# Patient Record
Sex: Male | Born: 1995 | Race: White | Hispanic: No | Marital: Single | Smoking: Never smoker
Health system: Southern US, Community
[De-identification: ages and names within clinical notes are randomized; demographics above are authoritative.]

## PROBLEM LIST (undated history)

## (undated) DIAGNOSIS — R310 Gross hematuria: Secondary | ICD-10-CM

## (undated) DIAGNOSIS — G809 Cerebral palsy, unspecified: Secondary | ICD-10-CM

## (undated) DIAGNOSIS — R569 Unspecified convulsions: Secondary | ICD-10-CM

## (undated) DIAGNOSIS — Z931 Gastrostomy status: Secondary | ICD-10-CM

## (undated) HISTORY — DX: Gross hematuria: R31.0

## (undated) HISTORY — DX: Gastrostomy status: Z93.1

## (undated) HISTORY — PX: CIRCUMCISION: SUR203

## (undated) HISTORY — DX: Cerebral palsy, unspecified: G80.9

## (undated) HISTORY — DX: Unspecified convulsions: R56.9

---
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
## 2017-01-17 ENCOUNTER — Encounter: Payer: Self-pay | Admitting: Pediatrics

## 2017-01-31 ENCOUNTER — Encounter: Payer: Self-pay | Admitting: Pediatrics

## 2017-02-23 ENCOUNTER — Other Ambulatory Visit (INDEPENDENT_AMBULATORY_CARE_PROVIDER_SITE_OTHER): Payer: Self-pay | Admitting: Neurology

## 2017-02-23 DIAGNOSIS — G40309 Generalized idiopathic epilepsy and epileptic syndromes, not intractable, without status epilepticus: Secondary | ICD-10-CM

## 2017-02-27 ENCOUNTER — Ambulatory Visit (INDEPENDENT_AMBULATORY_CARE_PROVIDER_SITE_OTHER): Payer: BLUE CROSS/BLUE SHIELD | Admitting: Pediatrics

## 2017-02-27 VITALS — Wt 97.0 lb

## 2017-02-27 DIAGNOSIS — L249 Irritant contact dermatitis, unspecified cause: Secondary | ICD-10-CM

## 2017-02-27 MED ORDER — LORATADINE 5 MG/5ML PO SYRP: 10.0000 mg | ORAL_SOLUTION | Freq: Every day | ORAL | 12 refills | Status: DC

## 2017-02-27 MED ORDER — SILVER SULFADIAZINE 1 % EX CREA: 1.0000 "application " | TOPICAL_CREAM | Freq: Two times a day (BID) | CUTANEOUS | 2 refills | Status: AC

## 2017-02-27 NOTE — Patient Instructions (Signed)

## 2017-02-27 NOTE — Progress Notes (Signed)
   22 year old with developmental delay and wheelchair bound now presents with raised red itchy rash to neck for the past three days. Mom says that he has been drooling a lot and the saliva is pooling on the right of his neck causing redness and peeling to the area.No fever, no discharge, no swelling of the area.   Review of Systems  Constitutional: Negative.  Negative for fever, activity change and appetite change.  HENT: Negative.  Negative for congestion and rhinorrhea.   Eyes: Negative.   Respiratory: Negative.  Negative for cough and wheezing.   Cardiovascular: Negative.   Gastrointestinal: Negative.   Musculoskeletal: wheelchair bound Neurological: developmental delay.  Hematological: Negative for adenopathy.         Objective:    Physical Exam  Constitutional: Appears well-developed and well-nourished.  HENT:  Right Ear: Tympanic membrane normal.  Left Ear: Tympanic membrane normal.  Nose: No nasal discharge.  Mouth/Throat: Mucous membranes are moist. No tonsillar exudate. Persistent drooling with neck flexed in position due to wheelchair back not able to recline.  Neck: Normal range of motion. No adenopathy. erythematous peeling rash to right side of neck extending to chest. Cardiovascular: Regular rhythm.  No murmur heard. Pulmonary/Chest: Effort normal. No respiratory distress. No retractions.  Abdominal: Soft. Bowel sounds are normal. No distension.  Musculoskeletal: No edema and no deformity.  Neurological: Alert and actve.  Skin: Skin is warm. Rash to neck as above.      Assessment:     Irritant contact dermatitis    Plan:   To prevent the drooling onto his neck he would need a New wheelchair required--custom back Silvadene cream BID X 1 week Follow if not improving

## 2017-02-28 ENCOUNTER — Encounter: Payer: Self-pay | Admitting: Pediatrics

## 2017-02-28 DIAGNOSIS — L249 Irritant contact dermatitis, unspecified cause: Secondary | ICD-10-CM | POA: Insufficient documentation

## 2017-04-07 ENCOUNTER — Telehealth: Payer: Self-pay | Admitting: Pediatrics

## 2017-04-07 NOTE — Telephone Encounter (Signed)
Form for  Medical necessity.

## 2017-05-07 ENCOUNTER — Telehealth: Payer: Self-pay | Admitting: Pediatrics

## 2017-05-07 NOTE — Telephone Encounter (Signed)
Letter for wheelchair written

## 2017-05-27 ENCOUNTER — Encounter: Payer: Self-pay | Admitting: Pediatrics

## 2017-06-27 ENCOUNTER — Other Ambulatory Visit (INDEPENDENT_AMBULATORY_CARE_PROVIDER_SITE_OTHER): Payer: Self-pay | Admitting: Neurology

## 2017-06-27 DIAGNOSIS — G40309 Generalized idiopathic epilepsy and epileptic syndromes, not intractable, without status epilepticus: Secondary | ICD-10-CM

## 2017-07-07 ENCOUNTER — Encounter: Payer: Self-pay | Admitting: Pediatrics

## 2017-07-07 ENCOUNTER — Ambulatory Visit (INDEPENDENT_AMBULATORY_CARE_PROVIDER_SITE_OTHER): Payer: BLUE CROSS/BLUE SHIELD | Admitting: Pediatrics

## 2017-07-07 VITALS — Temp 98.8°F | Wt 100.0 lb

## 2017-07-07 DIAGNOSIS — J309 Allergic rhinitis, unspecified: Secondary | ICD-10-CM | POA: Diagnosis not present

## 2017-07-07 MED ORDER — KETOCONAZOLE 2 % EX CREA: 1.0000 "application " | TOPICAL_CREAM | Freq: Every day | CUTANEOUS | 2 refills | Status: AC

## 2017-07-07 MED ORDER — MONTELUKAST SODIUM 10 MG PO TABS: 10.0000 mg | ORAL_TABLET | Freq: Every day | ORAL | 6 refills | Status: DC

## 2017-07-07 MED ORDER — BUDESONIDE 0.5 MG/2ML IN SUSP: 0.5000 mg | Freq: Every day | RESPIRATORY_TRACT | 12 refills | Status: DC

## 2017-07-07 MED ORDER — ALBUTEROL SULFATE (2.5 MG/3ML) 0.083% IN NEBU: 2.5000 mg | INHALATION_SOLUTION | Freq: Four times a day (QID) | RESPIRATORY_TRACT | 6 refills | Status: DC | PRN

## 2017-07-07 NOTE — Patient Instructions (Signed)
How to Use a Nebulizer, Adult A nebulizer is a device that turns liquid medicine into a mist (vapor) that you can breathe in (inhale). You may need to use a nebulizer if you have a breathing illness, such as asthma or pneumonia. There are different kinds of nebulizers. With some, you breathe in through a mouthpiece. With others, a mask fits over your nose and mouth. Risks and complications Using a nebulizer that does not fit right or is not cleaned right can lead to the following complications:  Infection.  Eye irritation.  Delivery of too much medicine or not enough medicine.  Mouth irritation.  How to prepare before using a nebulizer Take these steps before using your nebulizer: 1. Check your medicine. Make sure it has not expired and is not damaged in any way. 2. Wash your hands with soap and water. 3. Put all of the parts of your nebulizer on a sturdy, flat surface. Make sure all of the tubing is connected. 4. Measure the liquid medicine according to instructions from your health care provider. Pour the liquid into the part of the nebulizer that holds the medicine (reservoir). 5. Attach the mouthpiece or mask. 6. Test the nebulizer by turning it on to make sure that a spray comes out. Then, turn it off.  How to use a nebulizer 1. Sit down and relax. 2. If your nebulizer has a mask, put it over your nose and mouth. It should fit somewhat snugly, with no gaps around the nose or cheeks where medicine could escape. If you use a mouthpiece, put it in your mouth. Press your lips firmly around the mouthpiece. 3. Turn on the nebulizer. 4. Breathe out (exhale). 5. Some nebulizers have a finger valve. If yours does, cover up the air hole so the air gets to the nebulizer. 6. Once the medicine begins to mist out, take slow, deep breaths. If there is a finger valve, release it at the end of your breath. 7. Continue taking slow, deep breaths until the medicine in the nebulizer is gone and no  vapor appears. Be sure to stop the machine at any time if you start coughing or if the medicine foams or bubbles. How to clean a nebulizer The nebulizer and all of its parts must be kept very clean. If the nebulizer and its parts are not cleaned properly, bacteria can grow inside of them. If you inhale the bacteria, you can get sick. Follow the manufacturer's instructions for cleaning your nebulizer. For most nebulizers, you should follow these guidelines:  Wash the nebulizer after each use. Use warm water and soap. Make sure to wash the mouthpiece or mask and the medicine area, but do not wash the tubing or mouthpiece.  After you wash the nebulizer, place its parts on a clean towel and let them dry completely. After they dry, reconnect the pieces and turn the nebulizer on without any medicine in it. Doing this will blow air through the equipment to help dry it out.  Store the nebulizer in a clean and dust-free place.  Check the filter at least one time every week. Replace it if it looks dirty.  Contact a health care provider if:  You continue to have trouble breathing.  You have trouble using the nebulizer.  Your breathing gets worse during a nebulizer treatment.  Your nebulizer stops working, foams, or does not create a mist after you add medicine and turn it on. This information is not intended to replace advice given to   you by your health care provider. Make sure you discuss any questions you have with your health care provider. Document Released: 12/19/2008 Document Revised: 08/31/2015 Document Reviewed: 07/08/2015 Elsevier Interactive Patient Education  2018 Elsevier Inc.  

## 2017-07-08 ENCOUNTER — Encounter: Payer: Self-pay | Admitting: Pediatrics

## 2017-07-08 NOTE — Progress Notes (Signed)
Subjective:     [name] is a 22 y.o. male [place] who presents for evaluation and treatment of allergic symptoms. Symptoms include: clear rhinorrhea, cough, nasal congestion and postnasal drip and are present in a seasonal pattern. Precipitants include: ?Marland Kitchen Treatment currently includes nasal saline, oral antihistamines: zyrtec/claritin and is not effective. The following portions of the patient's history were reviewed and updated as appropriate: allergies, current medications, past family history, past medical history, past social history, past surgical history and problem list.  Review of Systems Pertinent items are noted in HPI.    Objective:    Temp 98.8 F (37.1 C) (Temporal)   Wt 100 lb (45.4 kg) Comment: reported  BMI 17.16 kg/m  General appearance: uncooperative and developmental delay Ears: normal TM's and external ear canals both ears Nose: clear discharge, mild congestion, turbinates swollen Lungs: clear to auscultation bilaterally Heart: regular rate and rhythm, S1, S2 normal, no murmur, click, rub or gallop Abdomen: g tube in situ Skin: scaly rash to right elbow Neurologic: global developmental delay   Assessment:    Allergic rhinitis.    Tine corporis   Plan:    Medications: nasal saline, intranasal steroids: flonase, oral decongestants: zyrtec, inhaled steroids: pulmicort, leukotrienes inhibitors:  singulair. Allergen avoidance discussed. Follow-up in a few days.

## 2017-09-04 ENCOUNTER — Telehealth: Payer: Self-pay | Admitting: Pediatrics

## 2017-09-04 NOTE — Telephone Encounter (Signed)
Medication form filled  

## 2017-09-04 NOTE — Telephone Encounter (Signed)
Form on your desk to fill out please °

## 2017-09-08 ENCOUNTER — Ambulatory Visit (INDEPENDENT_AMBULATORY_CARE_PROVIDER_SITE_OTHER): Payer: BLUE CROSS/BLUE SHIELD | Admitting: Pediatrics

## 2017-09-08 ENCOUNTER — Encounter: Payer: Self-pay | Admitting: Pediatrics

## 2017-09-08 VITALS — Wt 100.0 lb

## 2017-09-08 DIAGNOSIS — Z23 Encounter for immunization: Secondary | ICD-10-CM

## 2017-09-08 DIAGNOSIS — R0981 Nasal congestion: Secondary | ICD-10-CM

## 2017-09-08 MED ORDER — LORATADINE-PSEUDOEPHEDRINE ER 10-240 MG PO TB24: 1.0000 | ORAL_TABLET | Freq: Every day | ORAL | 6 refills | Status: AC

## 2017-09-08 NOTE — Patient Instructions (Signed)

## 2017-09-08 NOTE — Progress Notes (Signed)
ENT  Allergy  [facility] for suction machine for home use.  Subjective:     [name] is a 22 y.o. male who presents for evaluation of nasal congestion, productive cough, productive cough with sputum described as white, rhinorrhea clear and sneezing. Symptoms began a few months ago---around May of this year. Symptoms have been gradually worsening since that time. Past history is significant for pneumonia and global developmental delay/seizures/wheezing/cerebral palsy/spastic quadriplegia/and G tube feeds..  The following portions of the patient's history were reviewed and updated as appropriate: allergies, current medications, past family history, past medical history, past social history, past surgical history and problem list.  Review of Systems Pertinent items are noted in HPI.    Objective:    Wt 100 lb (45.4 kg)   BMI 17.16 kg/m  General appearance: distracted and uncooperative Ears: normal TM's and external ear canals both ears Nose: copious discharge, moderate congestion, turbinates swollen Throat: normal findings: lips normal without lesions Lungs: clear to auscultation bilaterally Heart: normal apical impulse Skin: hyperpigmentation - arm(s) right, elbow(s) right--non response to nizoral cream Neurologic: baseline developmental delay  Assessment:    Allergic Rhinitis, Cough, Reactive Airway Disease and Chronic nasal congestion    Continue albuterol and pulmicort nebs Continue singulair Start on claritin D Order for suction machine for home use Refer to ENT and allergist for further work up. Flu vaccine today   Plan:    Worsening signs and symptoms discussed. Rest, fluids, acetaminophen, and humidification. Follow up as needed for persistent, worsening cough, or appearance of new symptoms.

## 2017-09-11 NOTE — Addendum Note (Signed)
Addended by: Gari Crown on: 09/11/2017 02:27 PM   Modules accepted: Orders

## 2017-10-01 DIAGNOSIS — K219 Gastro-esophageal reflux disease without esophagitis: Secondary | ICD-10-CM | POA: Insufficient documentation

## 2017-10-09 ENCOUNTER — Telehealth: Payer: Self-pay | Admitting: Pediatrics

## 2017-10-09 NOTE — Telephone Encounter (Signed)
Community Support Service form on your desk to fill out please

## 2017-10-12 NOTE — Telephone Encounter (Signed)
Medication form filled  

## 2017-10-13 ENCOUNTER — Telehealth (INDEPENDENT_AMBULATORY_CARE_PROVIDER_SITE_OTHER): Payer: Self-pay | Admitting: Pediatrics

## 2017-10-13 NOTE — Telephone Encounter (Signed)
°  Who's calling (name and relationship to patient) : Selinda Eon (Mother) Best contact number: [phone] Provider they see: Dr. Gaynell Face Reason for call: Mom dropped off medical authorization form for caregiver. Form was placed in Provider's box.

## 2017-10-14 NOTE — Telephone Encounter (Signed)
L/M informing mom that forms were ready for pick up

## 2017-10-14 NOTE — Telephone Encounter (Signed)
Forms have been placed on Dr. Hickling's desk 

## 2017-10-21 ENCOUNTER — Encounter: Payer: Self-pay | Admitting: Pediatrics

## 2017-10-25 ENCOUNTER — Other Ambulatory Visit (INDEPENDENT_AMBULATORY_CARE_PROVIDER_SITE_OTHER): Payer: Self-pay | Admitting: Pediatrics

## 2017-10-25 DIAGNOSIS — G40309 Generalized idiopathic epilepsy and epileptic syndromes, not intractable, without status epilepticus: Secondary | ICD-10-CM

## 2017-11-28 ENCOUNTER — Other Ambulatory Visit (INDEPENDENT_AMBULATORY_CARE_PROVIDER_SITE_OTHER): Payer: Self-pay | Admitting: Pediatrics

## 2017-11-28 DIAGNOSIS — G40309 Generalized idiopathic epilepsy and epileptic syndromes, not intractable, without status epilepticus: Secondary | ICD-10-CM

## 2017-11-28 NOTE — Telephone Encounter (Signed)
Have not seen this patient since February 2018. How would you like to proceed?

## 2017-12-08 ENCOUNTER — Telehealth: Payer: Self-pay | Admitting: Pediatrics

## 2017-12-08 NOTE — Telephone Encounter (Signed)
Mom needs to talk to you about a suction machine that you all dicussed in August when he was here for a visit please

## 2017-12-15 ENCOUNTER — Encounter: Payer: Self-pay | Admitting: Pediatrics

## 2017-12-15 ENCOUNTER — Ambulatory Visit (INDEPENDENT_AMBULATORY_CARE_PROVIDER_SITE_OTHER): Payer: BLUE CROSS/BLUE SHIELD | Admitting: Pediatrics

## 2017-12-15 VITALS — Wt 100.0 lb

## 2017-12-15 DIAGNOSIS — R0981 Nasal congestion: Secondary | ICD-10-CM | POA: Diagnosis not present

## 2017-12-15 DIAGNOSIS — B9689 Other specified bacterial agents as the cause of diseases classified elsewhere: Secondary | ICD-10-CM | POA: Diagnosis not present

## 2017-12-15 DIAGNOSIS — J019 Acute sinusitis, unspecified: Secondary | ICD-10-CM | POA: Diagnosis not present

## 2017-12-15 LAB — POCT INFLUENZA A: RAPID INFLUENZA A AGN: NEGATIVE

## 2017-12-15 LAB — POCT INFLUENZA B: Rapid Influenza B Ag: NEGATIVE

## 2017-12-15 MED ORDER — AMOXICILLIN-POT CLAVULANATE 600-42.9 MG/5ML PO SUSR: 900.0000 mg | Freq: Two times a day (BID) | ORAL | 0 refills | Status: AC

## 2017-12-15 NOTE — Patient Instructions (Signed)

## 2017-12-15 NOTE — Progress Notes (Signed)
22 year old with cerebral palsy/Seizure/ and G tube pesents  with nasal congestion, cough and nasal discharge off and on for the past three weeks. Mom says she is also having fever X 2 days and now has thick green mucoid nasal discharge. Cough is keeping her up at night and he has decreased appetite.    Some post tussive vomiting but no diarrhea, no rash and no wheezing. Symptoms are persistent (>10 days), Severe (affecting sleep and feeding) and Severe (associated fever).   Nasal and oral suctioning needed--requested suction machine for home use   Review of Systems  Constitutional:  Negative for chills, activity change and appetite change.  HENT:  Negative for  trouble swallowing, voice change and ear discharge.   Eyes: Negative for discharge, redness and itching.  Respiratory:  Negative for  wheezing.   Cardiovascular: Negative for chest pain.  Gastrointestinal: Negative for vomiting and diarrhea.  Musculoskeletal: Negative for arthralgias.  Skin: Negative for rash.  Neurological: Negative for weakness.       Objective:   Physical Exam  Constitutional: Appears  well-nourished.   HENT:  Ears: Both TM's normal Nose: Profuse purulent nasal discharge.  Mouth/Throat: Mucous membranes are moist. No dental caries. No tonsillar exudate.  Eyes: Pupils are equal, round, and reactive to light.   Cardiovascular: Regular rhythm.  No murmur heard. Pulmonary/Chest: Effort normal and breath sounds normal. No nasal flaring. No respiratory distress. No wheezes with  no retractions.  Abdominal: Soft. G tube   Neurological: Baseline mental status---wheelchair bound  Skin: Skin is warm and moist. No rash noted.       Assessment:      Sinusitis--bacterial  Plan:     Will treat with oral antibiotics and follow as needed  Flu A and B negative       Nasal and oral suctioning needed--requested suction machine for home use

## 2017-12-17 NOTE — Telephone Encounter (Signed)
Ordered and received

## 2017-12-27 ENCOUNTER — Encounter (HOSPITAL_COMMUNITY): Payer: Self-pay | Admitting: Emergency Medicine

## 2017-12-27 ENCOUNTER — Emergency Department (HOSPITAL_COMMUNITY): Payer: BLUE CROSS/BLUE SHIELD

## 2017-12-27 ENCOUNTER — Other Ambulatory Visit: Payer: Self-pay

## 2017-12-27 ENCOUNTER — Emergency Department (HOSPITAL_COMMUNITY)
Admission: EM | Admit: 2017-12-27 | Discharge: 2017-12-28 | Disposition: A | Discharge status: Home / Self Care | Payer: BLUE CROSS/BLUE SHIELD | Attending: Emergency Medicine | Admitting: Emergency Medicine

## 2017-12-27 DIAGNOSIS — G809 Cerebral palsy, unspecified: Secondary | ICD-10-CM | POA: Insufficient documentation

## 2017-12-27 DIAGNOSIS — Z931 Gastrostomy status: Secondary | ICD-10-CM | POA: Diagnosis not present

## 2017-12-27 DIAGNOSIS — R569 Unspecified convulsions: Secondary | ICD-10-CM | POA: Insufficient documentation

## 2017-12-27 DIAGNOSIS — Z79899 Other long term (current) drug therapy: Secondary | ICD-10-CM | POA: Insufficient documentation

## 2017-12-27 MED ORDER — DIAZEPAM 10 MG RE GEL: 7.5000 mg | Freq: Once | RECTAL | Status: DC

## 2017-12-27 MED ORDER — LEVETIRACETAM IN NACL 500 MG/100ML IV SOLN
500.0000 mg | Freq: Once | INTRAVENOUS | Status: AC
  Administered 2017-12-27: 500 mg via INTRAVENOUS
  Filled 2017-12-27: qty 100

## 2017-12-27 NOTE — ED Notes (Signed)
Per provider pt needs an IV so nurse will draw labs at that time.

## 2017-12-27 NOTE — ED Triage Notes (Signed)
Patient from home, per mother he has been having seizure clusters since 2030 tonight that last about 15-20s. Patient stares off to the left, and brings an arm up. Mother also states patient is recovering from a virus before thanksgiving and just finished a round of Augmentin. Patient does have history of seizures and has been given his dose of Keppra PTA.

## 2017-12-28 LAB — CBC WITH DIFFERENTIAL/PLATELET
Abs Immature Granulocytes: 0.02 10*3/uL (ref 0.00–0.07)
Basophils Absolute: 0.1 10*3/uL (ref 0.0–0.1)
Basophils Relative: 1 %
Eosinophils Absolute: 0.2 10*3/uL (ref 0.0–0.5)
Eosinophils Relative: 2 %
HCT: 49.6 % (ref 39.0–52.0)
Hemoglobin: 15.7 g/dL (ref 13.0–17.0)
Immature Granulocytes: 0 %
Lymphocytes Relative: 18 %
Lymphs Abs: 1.5 10*3/uL (ref 0.7–4.0)
MCH: 29 pg (ref 26.0–34.0)
MCHC: 31.7 g/dL (ref 30.0–36.0)
MCV: 91.5 fL (ref 80.0–100.0)
Monocytes Absolute: 0.6 10*3/uL (ref 0.1–1.0)
Monocytes Relative: 7 %
Neutro Abs: 6 10*3/uL (ref 1.7–7.7)
Neutrophils Relative %: 72 %
Platelets: 300 10*3/uL (ref 150–400)
RBC: 5.42 MIL/uL (ref 4.22–5.81)
RDW: 12 % (ref 11.5–15.5)
WBC: 8.4 10*3/uL (ref 4.0–10.5)
nRBC: 0 % (ref 0.0–0.2)

## 2017-12-28 LAB — MAGNESIUM: Magnesium: 2.2 mg/dL (ref 1.7–2.4)

## 2017-12-28 LAB — BASIC METABOLIC PANEL
Anion gap: 8 (ref 5–15)
BUN: 12 mg/dL (ref 6–20)
CO2: 32 mmol/L (ref 22–32)
Calcium: 9.3 mg/dL (ref 8.9–10.3)
Chloride: 101 mmol/L (ref 98–111)
Creatinine, Ser: 0.55 mg/dL — ABNORMAL LOW (ref 0.61–1.24)
GFR calc Af Amer: >60 mL/min (ref >60)
GFR calc non Af Amer: >60 mL/min (ref >60)
Glucose, Bld: 147 mg/dL — ABNORMAL HIGH (ref 70–99)
Potassium: 4 mmol/L (ref 3.5–5.1)
Sodium: 141 mmol/L (ref 135–145)

## 2017-12-28 MED ORDER — SODIUM CHLORIDE 0.9 % IV BOLUS
500.0000 mL | Freq: Once | INTRAVENOUS | Status: AC
  Administered 2017-12-28: 500 mL via INTRAVENOUS

## 2017-12-28 NOTE — ED Provider Notes (Signed)
[facility] EMERGENCY DEPARTMENT Provider Note   CSN: [id number] Arrival date & time: 12/27/17  2223     History   Chief Complaint Chief Complaint  Patient presents with  . Seizures    HPI [name] is a 22 y.o. male with history of cerebral palsy, seizures, G-tube feedings who presents with a cluster of seizures that began this evening.  They are typical of his normal seizure activity.  He stares off to the left and has a teeth grinding episode that only lasts about 5 to 6 seconds.  They were happening every 3 to 5 minutes, per mother.  I witnessed 2 on my evaluation of the patient.  Patient has no postictal and returns to baseline immediately.  Patient was recently treated for a sinusitis and treated with Augmentin.  He has not had a fever this week.  Mother reports that the patient had a fever 1 day about a week ago.  His symptoms have been improving with the sinusitis.  HPI  Past Medical History:  Diagnosis Date  . Cerebral palsy ([place])   . G tube feedings ([place])   . 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 Hematuria, gross 12/25/2010  . Seizures [facility])     Patient Active Problem List   Diagnosis Date Noted  . Acute bacterial sinusitis 12/15/2017  . Chronic nasal congestion 09/08/2017  . Irritant dermatitis 02/28/2017  . Need for prophylactic vaccination and inoculation against influenza 12/19/2016  . Annual physical exam 11/30/2015  . Mild allergic rhinitis 08/01/2015  . 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 Cerebral palsy ([place]) 11/02/2012  . Incontinence of urine 05/27/2012  . 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 CIRCUMCISION  1999  . GASTROSTOMY TUBE PLACEMENT  2008  . HIP SURGERY  2010  . SPINAL FUSION  August 2014        Home Medications    Prior to Admission medications   Medication Sig Start Date End Date Taking? Authorizing Provider  albuterol (PROVENTIL) (2.5 MG/3ML) 0.083% nebulizer solution Take 3 mLs (2.5 mg total) by nebulization every 6 (six) hours as needed for wheezing or shortness of breath. 07/07/17 12/28/25 Yes Ramgoolam, Donnie Aho, MD  budesonide (PULMICORT) 0.5 MG/2ML nebulizer solution Take 2 mLs (0.5 mg total) by nebulization daily. 07/07/17  Yes Ramgoolam, Donnie Aho, MD  diazepam (DIASTAT ACUDIAL) 10 MG GEL Pharmacist dial to 7.5mg  Sig: Give 7.5mg  rectally for seizure lasting 2 minutes or longer. Patient taking differently: Place 7.5 mg rectally as needed for seizure.  09/07/15  Yes Rockwell Germany, NP  lansoprazole (PREVACID) 30 MG capsule Take 30 mg by mouth at bedtime.    Yes [provider]  levETIRAcetam (KEPPRA) 100 MG/ML solution TAKE EIGHT MILLILITERS BY MOUTH TWO TIMES A DAY Patient taking differently: Place 800 mg into feeding tube 2 (two) times daily.  11/28/17  Yes Jodi Geralds, MD  loratadine (CLARITIN) 5 MG/5ML syrup Take 10 mLs (10 mg total) by mouth daily for 28 days. Patient taking differently: Place 5 mg into feeding tube daily.  02/27/17 12/28/25 Yes Ramgoolam, Donnie Aho, MD  montelukast (SINGULAIR) 10 MG tablet Take 1 tablet (  10 mg total) by mouth at bedtime. 07/07/17 12/28/25 Yes Ramgoolam, Donnie Aho, MD  azithromycin [facility]) 200 MG/5ML suspension 1st day take 64ml by GT then 4 days 5.69ml daily for total 5 days treatment. Patient not taking: Reported on 12/28/2017 05/10/16   Kristen Loader, DO  CETIRIZINE HCL ALLERGY CHILD 5 MG/5ML SOLN TAKE 10ML DAILY Patient not taking: Reported on 12/28/2017 02/15/16 12/28/25  [name], MD   CETIRIZINE HCL ALLERGY CHILD 5 MG/5ML SOLN TAKE 10ML DAILY Patient not taking: Reported on 12/28/2017 11/12/16   [name], MD  levETIRAcetam (KEPPRA) 100 MG/ML solution TAKE 8ML BY MOUTH TWO TIMES A DAY Patient not taking: Reported on 12/28/2017 02/24/17   Teressa Lower, MD    Family History Family History  Problem Relation Age of Onset  . Hyperlipidemia Paternal Grandmother   . Diabetes Paternal Grandfather   . Hypertension Paternal Grandfather   . Alcohol abuse Neg Hx   . Arthritis Neg Hx   . Asthma Neg Hx   . Birth defects Neg Hx   . Cancer Neg Hx   . COPD Neg Hx   . Depression Neg Hx   . Drug abuse Neg Hx   . Early death Neg Hx   . Hearing loss Neg Hx   . Heart disease Neg Hx   . Kidney disease Neg Hx   . Learning disabilities Neg Hx   . Mental illness Neg Hx   . Stroke Neg Hx   . Miscarriages / Stillbirths Neg Hx   . Mental retardation Neg Hx   . Vision loss Neg Hx   . Varicose Veins Neg Hx     Social History Social History   Tobacco Use  . Smoking status: Never Smoker  . Smokeless tobacco: Never Used  Substance Use Topics  . Alcohol use: No  . Drug use: No     Allergies   Cephalosporins; Cefazolin; and Cephalexin   Review of Systems Review of Systems  Unable to perform ROS: Patient nonverbal     Physical Exam Updated Vital Signs BP 122/60   Pulse (!) 117   Temp 98.7 F (37.1 C) (Rectal)   Resp 16   Ht 5\' 4"  (1.626 m)   Wt 45.4 kg   SpO2 95%   BMI 17.16 kg/m   Physical Exam Vitals signs and nursing note reviewed.  Constitutional:      General: He is not in acute distress.    Appearance: He is well-developed. He is not diaphoretic.  HENT:     Head: Normocephalic and atraumatic.     Mouth/Throat:     Pharynx: No oropharyngeal exudate.  Eyes:     General: No scleral icterus.       Right eye: No discharge.        Left eye: No discharge.     Conjunctiva/sclera: Conjunctivae normal.     Pupils: Pupils are equal, round, and  reactive to light.  Neck:     Musculoskeletal: Normal range of motion and neck supple.     Thyroid: No thyromegaly.  Cardiovascular:     Rate and Rhythm: Regular rhythm.     Heart sounds: Normal heart sounds. No murmur. No friction rub. No gallop.   Pulmonary:     Effort: Pulmonary effort is normal. No respiratory distress.     Breath sounds: Normal breath sounds. No stridor. No wheezing or rales.  Abdominal:     General: Bowel sounds are normal. There is no distension.     Palpations:  Abdomen is soft.     Tenderness: There is no abdominal tenderness. There is no guarding or rebound.  Lymphadenopathy:     Cervical: No cervical adenopathy.  Skin:    General: Skin is warm and dry.     Coloration: Skin is not pale.     Findings: No rash.  Neurological:     Mental Status: He is alert.     Coordination: Coordination normal.     Comments: Witnessed seizure-like activity of leftward gaze and grinding of teeth and mouth twitching for about 5 seconds, occurred twice on my evaluation      ED Treatments / Results  Labs (all labs ordered are listed, but only abnormal results are displayed) Labs Reviewed  BASIC METABOLIC PANEL - Abnormal; Notable for the following components:      Result Value   Glucose, Bld 147 (*)    Creatinine, Ser 0.55 (*)    All other components within normal limits  CBC WITH DIFFERENTIAL/PLATELET  MAGNESIUM    EKG None  Radiology Dg Chest Portable 1 View  Result Date: 12/28/2017 CLINICAL DATA:  Recent sinus infection.  Tachycardia. EXAM: PORTABLE CHEST 1 VIEW COMPARISON:  None. FINDINGS: Cardiomediastinal silhouette is unremarkable for this low inspiratory examination with crowded vasculature markings. Mild bronchitic changes. The lungs are clear without pleural effusions or focal consolidations. Trachea projects midline and there is no pneumothorax. Included soft tissue planes and osseous structures are non-suspicious. Thoracolumbar Harrington rods.  IMPRESSION: Mild bronchitic changes without focal consolidation. Electronically Signed   By: Elon Alas M.D.   On: 12/28/2017 00:09    Procedures Procedures (including critical care time)  Medications Ordered in ED Medications  levETIRAcetam (KEPPRA) IVPB 500 mg/100 mL premix (0 mg Intravenous Stopped 12/28/17 0035)  sodium chloride 0.9 % bolus 500 mL (0 mLs Intravenous Stopped 12/28/17 0158)     Initial Impression / Assessment and Plan / ED Course  I have reviewed the triage vital signs and the nursing notes.  Pertinent labs & imaging results that were available during my care of the patient were reviewed by me and considered in my medical decision making (see chart for details).  Clinical Course as of Dec 28 241  Sun Dec 28, 2017  0100 I spoke with Dr. on call for patient's pediatric neurologist, Dr. Jordan Hawks, who advised 500 mg Keppra dose here and to increase patient's Keppra dose to 1000 mg twice daily.  Advised to only give Diastat if patient's seizure activity are lasting more than 5 minutes.   [AL]  0130 On reevaluation, patient not having seizure activity anymore.  Mother states he is acting at baseline.  Patient finishing a 500 mL fluid bolus, but plan for discharge home following.   [AL]    Clinical Course User Index [AL] Frederica Kuster, PA-C    Patient presenting with recurrent seizure-like activity.  I witnessed a couple episodes of my evaluation, however after observation in the ED, patient was acting at his baseline and had no seizure-like activity, per mother.  Labs are unremarkable.  Chest x-ray showed mild bronchitic changes without focal consolidation.  Patient just finished Augmentin.  Patient given 500 mL fluid bolus.  Mother plans to follow-up with Dr. Gaynell Face this week.  She is hesitant to increase patient's Keppra as advised by Dr. Wynona Neat, because Dr. Gaynell Face has never increased that quickly before.  Advise she can wait until he sees Dr. Gaynell Face,  but to return if any other concerning symptoms.  Mother understands and agrees  with plan.  Patient vitals stable at discharge in satisfactory condition. Patient also evaluated by my attending, Dr. Kathrynn Humble, who got patient's management and agrees with plan.  Final Clinical Impressions(s) / ED Diagnoses   Final diagnoses:  Seizure-like activity [facility])    ED Discharge Orders    None       Frederica Kuster, PA-C 12/28/17 Alda, Ankit, MD 12/28/17 [phone]

## 2017-12-28 NOTE — ED Notes (Signed)
Patient verbalizes understanding of discharge instructions. Opportunity for questioning and answers were provided. Armband removed by staff, pt discharged from ED.  

## 2017-12-28 NOTE — Discharge Instructions (Addendum)
Please follow-up with Dr. Gaynell Face this week for further management of Keiton's seizures. Dr. Jordan Hawks advised increasing Keppra to 48mL twice daily.  If you feel more comfortable waiting to increase Keppra until you see Dr. Gaynell Face, it is up to you. Please return to the emergency department if Fermon develops any new or worsening symptoms. It was a pleasure caring for him today!

## 2017-12-29 ENCOUNTER — Telehealth (INDEPENDENT_AMBULATORY_CARE_PROVIDER_SITE_OTHER): Payer: Self-pay | Admitting: Pediatrics

## 2017-12-29 DIAGNOSIS — G40309 Generalized idiopathic epilepsy and epileptic syndromes, not intractable, without status epilepticus: Secondary | ICD-10-CM

## 2017-12-29 NOTE — Telephone Encounter (Signed)
°  Who's calling (name and relationship to patient) : Selinda Eon (Mother) Best contact number: [phone] Provider they see: Dr. Gaynell Face  Reason for call: Mom lvm stating that pt was recently seen in the ED for seizure cluster. Mom stated the on call Provider at the time increased seizure medication and mom has questions about that. She would like to speak with Dr. Gaynell Face at his earliest convenience.

## 2017-12-30 MED ORDER — LEVETIRACETAM 100 MG/ML PO SOLN: ORAL | 1 refills | Status: DC

## 2017-12-30 NOTE — Telephone Encounter (Signed)
Mother was under the impression that he was at a top therapeutic level.  2000 mg/day is 44 mg/kg which is not too high.  I think the dose needs to be increased.  Mother understands that she needs to make an appointment to see me he has a spent a long time since [name]'s been in the office.  A prescription was written and sent to the pharmacy for 1 refill.

## 2018-01-29 ENCOUNTER — Other Ambulatory Visit: Payer: Self-pay | Admitting: Pediatrics

## 2018-02-03 ENCOUNTER — Ambulatory Visit (INDEPENDENT_AMBULATORY_CARE_PROVIDER_SITE_OTHER): Payer: BLUE CROSS/BLUE SHIELD | Admitting: Pediatrics

## 2018-02-03 ENCOUNTER — Encounter (INDEPENDENT_AMBULATORY_CARE_PROVIDER_SITE_OTHER): Payer: Self-pay | Admitting: Pediatrics

## 2018-02-03 VITALS — BP 110/70 | HR 100 | Wt 100.0 lb

## 2018-02-03 DIAGNOSIS — M4145 Neuromuscular scoliosis, thoracolumbar region: Secondary | ICD-10-CM | POA: Diagnosis not present

## 2018-02-03 DIAGNOSIS — Q040 Congenital malformations of corpus callosum: Secondary | ICD-10-CM

## 2018-02-03 DIAGNOSIS — Q043 Other reduction deformities of brain: Secondary | ICD-10-CM

## 2018-02-03 DIAGNOSIS — G40309 Generalized idiopathic epilepsy and epileptic syndromes, not intractable, without status epilepticus: Secondary | ICD-10-CM | POA: Diagnosis not present

## 2018-02-03 DIAGNOSIS — Q048 Other specified congenital malformations of brain: Secondary | ICD-10-CM

## 2018-02-03 DIAGNOSIS — G8250 Quadriplegia, unspecified: Secondary | ICD-10-CM

## 2018-02-03 DIAGNOSIS — H479 Unspecified disorder of visual pathways: Secondary | ICD-10-CM

## 2018-02-03 DIAGNOSIS — Q939 Deletion from autosomes, unspecified: Secondary | ICD-10-CM

## 2018-02-03 MED ORDER — LEVETIRACETAM 100 MG/ML PO SOLN: ORAL | 5 refills | Status: DC

## 2018-02-03 NOTE — Progress Notes (Signed)
Patient: [name] MRN: [id number] Sex: male DOB: December 21, 1995  Provider: Wyline Copas, MD Location of Care: [facility]  Note type: Routine return visit  History of Present Illness: Referral Source: [name], MD History from: mother, patient and [facility] chart Chief Complaint: General nonconvulsive epilepsy  [name] is a 22 y.o. male who returns on February 03, 2018 for the first time since March 13, 2016.  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  He had operations on his hips and a Harrington rod procedure.  He has spontaneous and stimulus sensitive myoclonus.    In mid-November, he had a cluster of seizures, which was unusual.  He had not been ill.  He was receiving his medicine as prescribed.  Levetiracetam was increased from 800 mg twice daily to a 1000 mg twice daily and he has done better.  His sporadic episodes that are very brief and are at baseline.  He has had some upper respiratory infections, but did not appear to be ill at the time that he had cluster of seizures.  His current seizures are manifested by turning of his head and staring briefly.  He does not sleep soundly.  He goes to bed in his own room and sometimes falls asleep quickly, other times may be awake for as long as a half hour to an hour.  If allowed to sleep, he would sleep "half a day."  This suggests that once he gets sleep that he sleeps well.  His mother says that he has arousals at nighttime, but for the most part she is unaware of when he wakes up.  He also will stir when people get up in the morning to leave for school or work.  He is tube fed.  His weight is stable.  He  attends [employer] and will be able to do so for couple of years.  His mother, I think, works at [employer].  She believes that triggers for his seizures include sleep deprivation and changes in the weather.  The latter is something that I have not often seen.  Overall, it is her opinion that the patient is neurologically stable and that physically his health is good.  Review of Systems: A complete review of systems was remarkable for mom reports that the patient had a cluster of seizures back in November. She states that the seizure medication was increased due to this. She states that since then, he has had some breakthrough seizures. No other concerns, all other systems reviewed and negative.  Past Medical History Diagnosis Date  . Cerebral palsy ([place])   . G tube feedings ([place])   . 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 Hematuria, gross 12/25/2010  . Seizures ([place])    Hospitalizations: No., Head Injury: No., Nervous System Infections: No., Immunizations up to date: Yes.    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  MRI of the brain December 11, 2000 shows agenesis of the corpus callosum, hydrocephalus ex vacuo, and coarse gyral configuration suggesting pachygyria.   He has spastic quadriparesis, severe dysphagia requiring feeding by percutaneous gastrostomy, cortical visual impairment, and severe cognitive impairment. 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 He has had multiple operations on his hips  and a Harrington rod procedure.  I saw him because of spontaneous and stimulus-sensitive myoclonus. He has also had episodes of eye deviation, jaw thrusting, and myoclonic movements that suggested the possibility of seizures.  Routine EEG on November 22, 2011 showed diffuse background slowing without seizures.  He was unable to cooperate for sleep study in [place] and would not keep the leads on his head, face,  or body. January, 2015  He had a polysomnogram at [facility] on May 15, 2013, that failed to show significant issues of arousal either with apnea or periodic limb movements. There were no seizures noted.   Birth History 7 lbs. 9 oz. Infant born at [redacted] weeks gestational age to a g 2 p 1 0 0 1 male.  Gestation was uncomplicated  Mother received Epidural anesthesia normal spontaneous vaginal delivery  Nursery Course was uncomplicated  Growth and Development was recalled and recorded as globally delayed  Behavior History none  Surgical History Past Surgical History:  Procedure Laterality Date  . CIRCUMCISION  1999  . GASTROSTOMY TUBE PLACEMENT  2008  . HIP SURGERY  2010  . SPINAL FUSION  August 2014    Family History family history includes Diabetes in his paternal grandfather; Hyperlipidemia in his paternal grandmother; Hypertension in his paternal grandfather. Family history is negative for migraines, seizures, intellectual disabilities, blindness, deafness, birth defects, chromosomal disorder, or autism.  Social History Social History   Socioeconomic History  . Marital status: Single    Spouse name: Not on file  . Number of children: Not on file  . Years of education: Not on file  . Highest education level: Not on file  Occupational History  . Not on file  Social Needs  . Financial resource strain: Not on file  . Food insecurity:    Worry: Not on file    Inability: Not on file  . Transportation needs:    Medical: Not on file    Non-medical: Not on file  Tobacco Use  . Smoking status: Never Smoker  . Smokeless tobacco: Never Used  Substance and Sexual Activity  . Alcohol use: No  . Drug use: No  . Sexual activity: Never  Lifestyle  . Physical activity:    Days per week: Not on file    Minutes per session: Not on file  . Stress: Not on file  Relationships  . Social connections:    Talks on phone: Not on file    Gets together: Not on file    Attends  religious service: Not on file    Active member of club or organization: Not on file    Attends meetings of clubs or organizations: Not on file    Relationship status: Not on file  Other Topics Concern  . 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 He lives with his parents and siblings.      Allergies Allergies  Allergen Reactions  . Cephalosporins Hives    Neck and face  . Cefazolin Hives  . Cephalexin Hives    Physical Exam BP 110/70   Pulse 100   Wt 100 lb (45.4 kg)   BMI 17.16 kg/m   General: alert, well developed, well nourished, in no acute distress, brown hair, brown eyes, non-handed Head: microcephalic, no dysmorphic features Ears, Nose and Throat: Otoscopic: tympanic membranes normal; pharynx: oropharynx is pink without exudates or tonsillar hypertrophy Neck: supple, full range of motion, no cranial or  cervical bruits Respiratory: auscultation clear Cardiovascular: no murmurs, pulses are normal Musculoskeletal: no skeletal deformities or apparent scoliosis Skin: no rashes or neurocutaneous lesions  Neurologic Exam  Mental Status: alert; sitting upright in his wheelchair, aware of the examiner but is unable to follow commands or speak; occasionally grunts Cranial Nerves: visual fields are full to double simultaneous stimuli; he will only briefly fix and follow extraocular movements are full and not always conjugate; pupils are round, reactive to light; funduscopic examination shows positive red reflex with photophobia; impassive symmetric facial strength; midline tongue; turns to localize sound inconsistently bilaterally  Motor: moves all 4 extremities but has very limited fine motor movements Sensory: withdrawal x4 Coordination: unable to test Gait and Station: wheelchair-bound; does not reliably bear weight on his legs Reflexes: symmetric and diminished bilaterally due to cocontraction; no clonus;  bilateral flexor plantar responses  Assessment 1. Generalized nonconvulsive epilepsy, G40.309. 2. 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 3. 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 4. 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 5. 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 6. 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 7. 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  Discussion I am not certain why the patient had clusters of seizures in November.  I think that this could happen as he gets older.  Children who have developmental disorders of their brain as they get older sometimes have increasing problems with their seizures.  It is my hope by increasing levetiracetam that we will control his seizures.  I do not think that we can expect that he is going to grow further and that further changes would be related to seizures occurring because of growth.  Plan We can push his levetiracetam 1500 mg but there is no reason to do so when his seizures remain at baseline.  I refilled his prescription and we will do it again in 6 months.  He will return to see me in a year but I will be happy to see him sooner based on clinical needs.  Greater than 50% of a 25 minute visit was spent in counseling, coordination of care concerning his seizures, his spasticity, and managing him at home.   Medication List   Accurate as of February 03, 2018 11:59 PM. Always use your most recent med list.    albuterol (2.5 MG/3ML) 0.083% nebulizer solution Commonly known as:  PROVENTIL Take 3 mLs (2.5 mg total) by nebulization every 6 (six) hours as needed for wheezing or shortness of breath.   budesonide 0.5 MG/2ML nebulizer solution Commonly known as:  PULMICORT Take 2 mLs (0.5 mg total) by nebulization daily.   diazepam 10 MG Gel Commonly known as:  DIASTAT ACUDIAL Pharmacist dial to 7.5mg  Sig: Give 7.5mg  rectally for seizure lasting 2 minutes or longer.   lansoprazole 30 MG capsule Commonly known as:  PREVACID Take 30 mg by mouth at bedtime.     levETIRAcetam 100 MG/ML solution Commonly known as:  KEPPRA Take 10 mL twice daily   loratadine 5 MG/5ML syrup Commonly known as:  CLARITIN Take 10 mLs (10 mg total) by mouth daily for 28 days.   montelukast 10 MG tablet Commonly known as:  SINGULAIR TAKE ONE TABLET BY MOUTH AT BEDTIME    The medication list was reviewed and reconciled. All changes or newly prescribed medications were explained.  A complete medication list was provided to the patient/caregiver.  Jodi Geralds MD

## 2018-02-03 NOTE — Patient Instructions (Signed)
I appreciate being able to see [name].  I do not understand why he had a cluster of seizures in November.  Increasing his dose of levetiracetam was appropriate because he has grown.  Hopefully this will not happen again, but as he gets older it is possible.  We could push his dose up to 1500 mg twice a day but there is no reason to do it as long as his seizures remain at baseline.  I will refill your prescription and do it again in 6 months.  I plan to see him in a year.  Please get in touch with me if there is anything else that I can do in particular for the guardianship issue.

## 2018-02-18 ENCOUNTER — Telehealth (INDEPENDENT_AMBULATORY_CARE_PROVIDER_SITE_OTHER): Payer: Self-pay | Admitting: Pediatrics

## 2018-02-18 NOTE — Telephone Encounter (Signed)
°  Who's calling (name and relationship to patient) : Phillis Page (case Freight forwarder at [employer] support)  Best contact number: [phone] Provider they see: Gaynell Face Reason for call: Please refax med list for [name], only 1 page was received. . [phone]  PRESCRIPTION REFILL ONLY  Name of prescription:  Pharmacy:

## 2018-02-19 NOTE — Telephone Encounter (Signed)
Faxed through Epic.

## 2018-03-02 ENCOUNTER — Ambulatory Visit: Payer: BLUE CROSS/BLUE SHIELD | Admitting: Pediatrics

## 2018-03-03 ENCOUNTER — Telehealth: Payer: Self-pay | Admitting: Pediatrics

## 2018-03-03 NOTE — Telephone Encounter (Signed)
[employer] form on your desk to fill out

## 2018-03-04 ENCOUNTER — Telehealth (INDEPENDENT_AMBULATORY_CARE_PROVIDER_SITE_OTHER): Payer: Self-pay | Admitting: Pediatrics

## 2018-03-04 NOTE — Telephone Encounter (Signed)
°  Who's calling (name and relationship to patient) : Laverna Peace (Case Manager, Film/video editor) Best contact number: [phone] Provider they see: Dr. Gaynell Face  Reason for call: Silva Bandy stated she needs a physician order for pt's Keppra dosage increase.   (F) [phone]

## 2018-03-04 NOTE — Telephone Encounter (Signed)
Please complete an order that can be faxed to the facility with the patient's updated dose for Keppra

## 2018-03-04 NOTE — Telephone Encounter (Signed)
Med list copied and signed.

## 2018-03-11 ENCOUNTER — Telehealth (INDEPENDENT_AMBULATORY_CARE_PROVIDER_SITE_OTHER): Payer: Self-pay | Admitting: Pediatrics

## 2018-03-11 NOTE — Telephone Encounter (Signed)
°  Who (name and relationship to patient) : [name] (Mother) Best contact number: [phone] Provider they see: Dr. Gaynell Face  Reason for call: Mom dropped off seizure medication form for Dr. Gaynell Face to update the medication dosage increase. She also stated that the pharm denied request for seizure medication. She stated medicaid will not cover it. She stated the pharm needs doctor's approval. Forms have been placed in Dr. Melanee Left box. Seizure medication form should be faxed to the number provided below. ROI is on file for community support services.   [employer] [phone]

## 2018-03-12 NOTE — Telephone Encounter (Signed)
Forms have been placed on Dr. Melanee Left desk

## 2018-03-12 NOTE — Telephone Encounter (Signed)
Form was signed, dose was changed which is the reason we requested a refill before the expected time.  Please contact the pharmacy manager.

## 2018-03-13 ENCOUNTER — Telehealth (INDEPENDENT_AMBULATORY_CARE_PROVIDER_SITE_OTHER): Payer: Self-pay | Admitting: Pediatrics

## 2018-03-13 NOTE — Telephone Encounter (Signed)
Refaxed the papers with the start date as requested

## 2018-03-13 NOTE — Telephone Encounter (Signed)
°  Who's calling (name and relationship to patient) : Teacher, English as a foreign language  (case Freight forwarder) Best contact number: [phone] Provider they see: Gaynell Face Reason for call: Please call with the actual date that Jos's Keppra was increased.  Mom could not remember and this information was not included on the form sent yesterday.      PRESCRIPTION REFILL ONLY  Name of prescription:  Pharmacy:

## 2018-03-17 ENCOUNTER — Encounter: Payer: Self-pay | Admitting: Pediatrics

## 2018-03-17 ENCOUNTER — Ambulatory Visit (INDEPENDENT_AMBULATORY_CARE_PROVIDER_SITE_OTHER): Payer: BLUE CROSS/BLUE SHIELD | Admitting: Pediatrics

## 2018-03-17 VITALS — BP 120/66 | Ht 64.0 in | Wt 100.0 lb

## 2018-03-17 DIAGNOSIS — G800 Spastic quadriplegic cerebral palsy: Secondary | ICD-10-CM

## 2018-03-17 DIAGNOSIS — Q939 Deletion from autosomes, unspecified: Secondary | ICD-10-CM

## 2018-03-17 DIAGNOSIS — Z931 Gastrostomy status: Secondary | ICD-10-CM

## 2018-03-17 DIAGNOSIS — N3942 Incontinence without sensory awareness: Secondary | ICD-10-CM | POA: Diagnosis not present

## 2018-03-17 DIAGNOSIS — Z0000 Encounter for general adult medical examination without abnormal findings: Secondary | ICD-10-CM

## 2018-03-17 DIAGNOSIS — Z0001 Encounter for general adult medical examination with abnormal findings: Secondary | ICD-10-CM | POA: Diagnosis not present

## 2018-03-17 DIAGNOSIS — G8250 Quadriplegia, unspecified: Secondary | ICD-10-CM

## 2018-03-17 MED ORDER — LEVOCETIRIZINE DIHYDROCHLORIDE 2.5 MG/5ML PO SOLN: 5.0000 mg | Freq: Every evening | ORAL | 12 refills | Status: DC

## 2018-03-17 NOTE — Progress Notes (Signed)
   History of Present Illness Main concerns today are: ADULT GI referral  Dental cleaning --[facility] --needs sedation for cleaning   Developmental History Developmental delay Seizures Spastic quadriplegia   Function: Mobility: manual wheelchair Pain concerns: no Hand function:  Right: reduced dexterity  Left: reduced dexterity Spine curvature: mild Swallowing: normal and modified diet (pureed) Toileting: dependent   Equipment:  Wheelchair AFO's Hand splints Patent examiner out chair Suction tubes and suction machine Nebulizer Pulse ox Oxygen tubes and tank Diapers Wipes and gloves G -tube button with Tubings  DIET--Liquid Hope 1 pouch 3.5 times a day  Oral feeds--Pureed--NECTAR Full liquid  Therapy at school--Speech/OT/PT   Specialists- Neuro-DR Hickling GI--needs new adult ENT --Dr Janace Hoard Ortho-Dr Neldon Mc Pulmonary-N/A Cardio--N/A Endocrinology--N/A Dental--Needs one Ophthal--Dr Annamaria Boots Urology--N/A Surgeon--N/A    His GI has retired and mom is requesting a new GI for follow up of his GERD and other GI issues.  Review of Systems: Vision: impaired Hearing: impaired Seizures: yes - controlled Constipation: no GE reflux: yes - followed by GI Fractures: no  The following portions of the patient's history were reviewed and updated as appropriate: allergies, current medications, past family history, past medical history, past social history, past surgical history and problem list.  Referrals needed-- Adult GI Dentist for special needs  Objective:    Physical Exam  Cognition: non-interactive Respiratory: normal, no increased effort Lower extremity function:  Right: has on AFO to ankle  Left: AFO to ankle Actively wearing AFO at visit today   Abdomen: normal--no G tube present Spine scoliosis: mild --post surgery Sitting Ability: assisted Gait: wheelchair    Assessment:    Annual physical exam   Plan:    1. Gross  motor: delayed 2. Fine motor/ADL: delayed 3. Educational/vocational: [employer] 4. Transition skills: n/a 5. Speech/swallowing: no speech, GERD 6. Orthopedics/bracing: bilateral AFO's --present today 7. Other equipment:  bath chair, gait trainer, hand/wrist splint(s), life system, stander, walker, wheelchair and RAMP for house.

## 2018-03-19 NOTE — Telephone Encounter (Signed)
Forms filled

## 2018-03-20 ENCOUNTER — Encounter: Payer: Self-pay | Admitting: Pediatrics

## 2018-03-20 NOTE — Patient Instructions (Signed)
Seizure, Adult °When you have a seizure: °· Parts of your body may move. °· You may have a change in how aware or awake (conscious) you are. °· You may shake (convulse). °Seizures usually last from 30 seconds to 2 minutes. Usually, they are not harmful unless they last a long time. °What are the signs or symptoms? °Common symptoms of this condition include: °· Shaking (convulsions). °· Stiffness in the body. °· Passing out (losing consciousness). °· Uncontrolled movements in the: °? Arms or legs. °? Eyes. °? Head. °? Mouth. °Some people have symptoms right before a seizure happens. These symptoms may include: °· Fear. °· Worry (anxiety). °· Feeling like you are going to throw up (nausea). °· Feeling like the room is spinning (vertigo). °· Feeling like you saw or heard something before (déjà vu). °· Odd tastes or smells. °· Changes in vision, such as seeing flashing lights or spots. °Follow these instructions at home: °Medicines ° °· Take over-the-counter and prescription medicines only as told by your doctor. °· Do not eat or drink anything that may keep your medicine from working, such as alcohol. °Activity °· Do not do any activities that would be dangerous if you had another seizure, like driving or swimming. Wait until your doctor says it is safe for you to do them. °· If you live in the [place] ask your local DMV (department of motor vehicles) when you can drive. °· Get plenty of rest. °Teaching others ° °· Teach friends and family what to do when you have a seizure. They should: °? Lay you on the ground. °? Protect your head and body. °? Loosen any tight clothing around your neck. °? Turn you on your side. °? Not hold you down. °? Not put anything into your mouth. °? Know whether or not you need emergency care. °? Stay with you until you are better. °General instructions °· Contact your doctor each time you have a seizure. °· Avoid anything that gives you seizures. °· Keep a seizure diary. Write down: °? What  you think caused each seizure. °? What you remember about each seizure. °· Keep all follow-up visits as told by your doctor. This is important. °Contact a doctor if: °· You have another seizure. °· You have seizures more often. °· There is any change in what happens during your seizures. °· You keep having seizures with treatment. °· You have symptoms of being sick or having an infection. °Get help right away if: °· You have a seizure: °? That lasts longer than 5 minutes. °? That is different than seizures you had before. °? That makes it harder to breathe. °? After you hurt your head. °· After a seizure, you cannot speak or use a part of your body. °· After a seizure, you are confused or have a bad headache. °· You have two or more seizures in a row. °· You are having seizures more often. °· You do not wake up right after a seizure. °· You get hurt during a seizure. °In an emergency: °· These symptoms may be an emergency. Do not wait to see if the symptoms will go away. Get medical help right away. Call your local emergency services (911 in the [place] Do not drive yourself to the hospital. °Summary °· Seizures usually last from 30 seconds to 2 minutes. Usually, they are not harmful unless they last a long time. °· Do not eat or drink anything that may keep your medicine from working, such as alcohol. °·   Teach friends and family what to do when you have a seizure. °· Contact your doctor each time you have a seizure. °This information is not intended to replace advice given to you by your health care provider. Make sure you discuss any questions you have with your health care provider. °Document Released: 06/19/2007 Document Revised: 09/24/2017 Document Reviewed: 02/06/2017 °Elsevier Interactive Patient Education © 2019 Elsevier Inc. ° °

## 2018-03-21 NOTE — Addendum Note (Signed)
Addended by: Gari Crown on: 03/21/2018 09:32 AM   Modules accepted: Orders

## 2018-04-21 ENCOUNTER — Telehealth: Payer: Self-pay | Admitting: Pediatrics

## 2018-04-21 NOTE — Telephone Encounter (Signed)
4/6  435pm  Mom called with concerns of 2-3 days if increase sleep.  [name] is a complex patient with spastic CP, global delay, non verbal.  Mom has concerns due to recent covid 19 spread.  They are taking good precautions and no one else in home currently has symptoms but they both have been out in public areas.  She reports the thermometer has given all ranges of normal to low grade fevers.  He has had periods of increase heart rate, no decrease in sats or retractions.  He has always had allergies ongoing and has a cough and yesterday sounded tighter than usual.  Discussed with mom would continue on his allergy meds as prescribed.  Monitor closely in the coming days for worsening symptoms or new symptoms like increase fever, diff breathing, decrease in sats.  If he were to start having symptoms like difficulty breathing and decrease sats and increase fevers and lethargy she could need to take him to the ER.  If he is otherwise stable and monitoring symptoms then would stay home and quarantine in place.  Call back for further concerns.

## 2018-05-27 ENCOUNTER — Telehealth: Payer: Self-pay | Admitting: Pediatrics
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
## 2018-05-28 NOTE — Telephone Encounter (Signed)
Letter written for new van---unable to get in touch with TOCARO MEDLEY at the number provided--[phone].

## 2018-08-15 ENCOUNTER — Other Ambulatory Visit: Payer: Self-pay | Admitting: Pediatrics

## 2018-08-20 ENCOUNTER — Telehealth: Payer: Self-pay | Admitting: Pediatrics

## 2018-08-20 NOTE — Telephone Encounter (Signed)
Mom in the office and asked if Dr Laurice Record could add the specifics to the letter that he wrote yesterday for [place] for the medical necessities for the [facility]. She would like the revised letter emailed to [email or web address]

## 2018-08-21 ENCOUNTER — Telehealth (INDEPENDENT_AMBULATORY_CARE_PROVIDER_SITE_OTHER): Payer: Self-pay | Admitting: Pediatrics

## 2018-08-21 NOTE — Telephone Encounter (Signed)
I called Mom back. She said that Dad gave the morning Levetiracetam, then she gave it not realizing that he had already given it. I told Mom that [name] may be sleepy today but that he should be ok. I told her to allow him to nap if needed, and suggested that she give the night time dose and hour or so later than usual, then stay on track with future doses. Mom agreed with this plan. TG

## 2018-08-21 NOTE — Telephone Encounter (Signed)
°  Who's calling (name and relationship to patient) : Montour,Michele Best contact number: [phone] Provider they see: Gaynell Face Reason for call: Mom and dad accidentally gave [name] 2 doses of his seizure medication this morning.  Mom states she called the office earlier and was advised to call the pharmacy.  She could not be advised as what to do by the pharmacy so she called our office again.  Please call mom ASAP to advise.    PRESCRIPTION REFILL ONLY  Name of prescription:  Pharmacy:

## 2018-08-24 NOTE — Telephone Encounter (Signed)
error 

## 2018-08-27 NOTE — Telephone Encounter (Signed)
Letter written for [name] with specific modifications as requested

## 2018-09-02 ENCOUNTER — Other Ambulatory Visit (INDEPENDENT_AMBULATORY_CARE_PROVIDER_SITE_OTHER): Payer: Self-pay | Admitting: Pediatrics

## 2018-09-02 DIAGNOSIS — G40309 Generalized idiopathic epilepsy and epileptic syndromes, not intractable, without status epilepticus: Secondary | ICD-10-CM

## 2018-11-19 ENCOUNTER — Telehealth: Payer: Self-pay | Admitting: Pediatrics

## 2018-11-19 MED ORDER — LANSOPRAZOLE 30 MG PO CPDR: 30.0000 mg | DELAYED_RELEASE_CAPSULE | Freq: Two times a day (BID) | ORAL | 12 refills | Status: DC

## 2018-11-19 NOTE — Telephone Encounter (Signed)
Called in reflux medications to [facility] [place]

## 2018-11-19 NOTE — Telephone Encounter (Signed)
Ross has been seeing Dr Clenton Pare and he has aged and mom wants to know if you can prescibe his reflux medicine the medicine is lansodrazole 30 mg /5 mls 150 ml compounded at [facility] per mom

## 2018-12-07 ENCOUNTER — Telehealth: Payer: Self-pay | Admitting: Pediatrics

## 2018-12-07 DIAGNOSIS — G800 Spastic quadriplegic cerebral palsy: Secondary | ICD-10-CM

## 2018-12-07 NOTE — Telephone Encounter (Signed)
Mom called and would like Dr Laurice Record to write an order to [address] for a portable Oxygen bottle for [name] for travel.

## 2018-12-08 NOTE — Telephone Encounter (Signed)
Order written --to be faxed to [place] care

## 2018-12-14 IMAGING — DX DG CHEST 1V PORT
1 series · 1 of 1 positions shown · non-contrast
Comparison: None.

CLINICAL DATA: Recent sinus infection.  Tachycardia.

EXAM:
PORTABLE CHEST 1 VIEW

[chest]
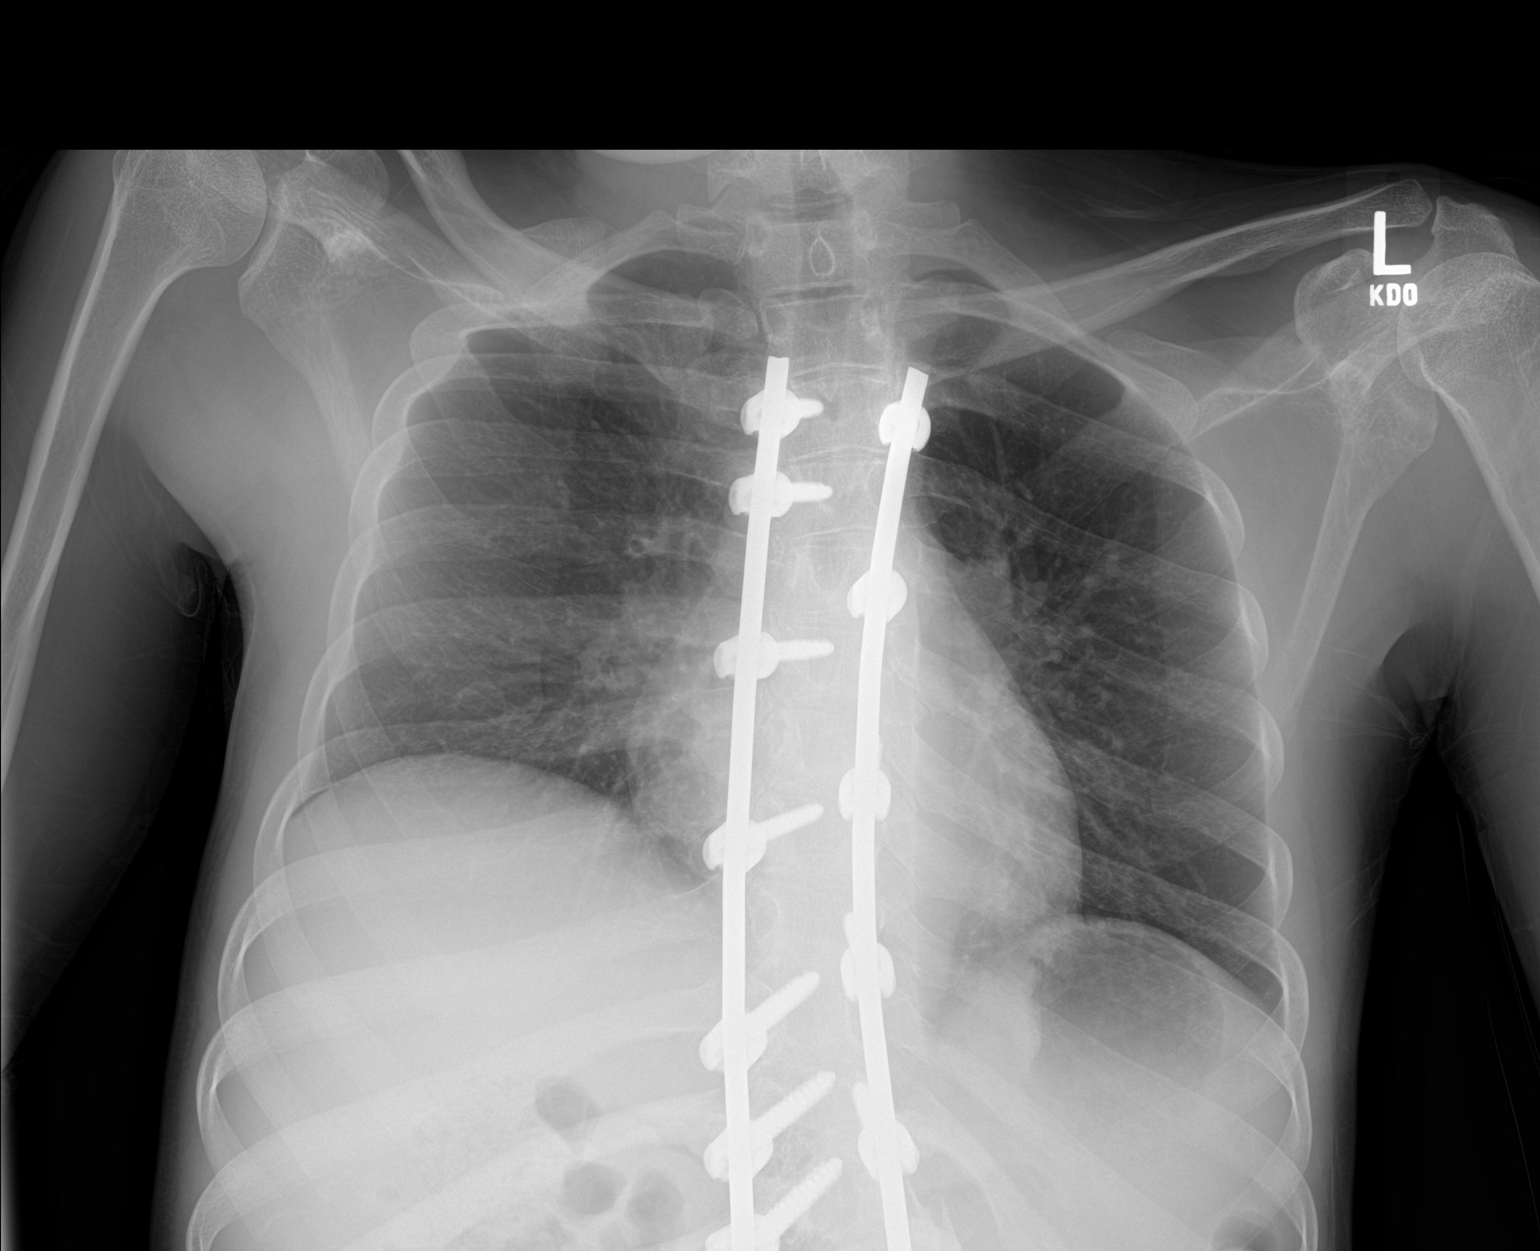

[1 of 1 positions shown; findings below may reference images not displayed]

FINDINGS: Cardiomediastinal silhouette is unremarkable for this low
inspiratory examination with crowded vasculature markings. Mild
bronchitic changes. The lungs are clear without pleural effusions or
focal consolidations. Trachea projects midline and there is no
pneumothorax. Included soft tissue planes and osseous structures are
non-suspicious. Thoracolumbar Harrington rods.
IMPRESSION: Mild bronchitic changes without focal consolidation.

## 2019-01-04 ENCOUNTER — Other Ambulatory Visit (INDEPENDENT_AMBULATORY_CARE_PROVIDER_SITE_OTHER): Payer: Self-pay | Admitting: Pediatrics

## 2019-01-04 DIAGNOSIS — G40309 Generalized idiopathic epilepsy and epileptic syndromes, not intractable, without status epilepticus: Secondary | ICD-10-CM

## 2019-01-17 ENCOUNTER — Other Ambulatory Visit: Payer: Self-pay | Admitting: Pediatrics

## 2019-01-25 ENCOUNTER — Telehealth: Payer: Self-pay | Admitting: Pediatrics

## 2019-01-25 DIAGNOSIS — G800 Spastic quadriplegic cerebral palsy: Secondary | ICD-10-CM

## 2019-01-25 NOTE — Telephone Encounter (Signed)
Mrs Lampa called and would like to talk to you about a RX she needs and oxygen to carry on trips. He doesn't need a check up until March 2021.

## 2019-01-27 MED ORDER — LANSOPRAZOLE 30 MG PO CPDR: 30.0000 mg | DELAYED_RELEASE_CAPSULE | Freq: Every day | ORAL | 0 refills | Status: DC

## 2019-02-01 ENCOUNTER — Encounter: Payer: Self-pay | Admitting: Pediatrics

## 2019-02-26 ENCOUNTER — Telehealth: Payer: Self-pay | Admitting: Pediatrics

## 2019-02-26 MED ORDER — LANSOPRAZOLE 30 MG PO CPDR: 30.0000 mg | DELAYED_RELEASE_CAPSULE | Freq: Every day | ORAL | 12 refills | Status: DC

## 2019-02-26 NOTE — Telephone Encounter (Signed)
Refilled medications

## 2019-02-26 NOTE — Telephone Encounter (Signed)
Mom called and Needs a Rx called in for prevacid called in to [facility] please

## 2019-03-03 ENCOUNTER — Telehealth: Payer: Self-pay | Admitting: Pediatrics

## 2019-03-03 DIAGNOSIS — G800 Spastic quadriplegic cerebral palsy: Secondary | ICD-10-CM

## 2019-03-03 NOTE — Telephone Encounter (Signed)
Mom  called and [facility] has not gotten our request for a oxygen bottle to use in a emergency or to trave (not a converter) can you please send it again per mom

## 2019-03-09 NOTE — Telephone Encounter (Signed)
Script written for a portable oxygent for emergency use

## 2019-04-28 ENCOUNTER — Other Ambulatory Visit (INDEPENDENT_AMBULATORY_CARE_PROVIDER_SITE_OTHER): Payer: Self-pay | Admitting: Pediatrics

## 2019-04-28 DIAGNOSIS — G40309 Generalized idiopathic epilepsy and epileptic syndromes, not intractable, without status epilepticus: Secondary | ICD-10-CM

## 2019-04-28 NOTE — Telephone Encounter (Signed)
Will you please call to schedule this patient

## 2019-04-28 NOTE — Telephone Encounter (Signed)
Please call the family and/or the pharmacy and insist upon the visit before we refill prescriptions again.

## 2019-04-28 NOTE — Telephone Encounter (Signed)
If approved, send refill. Patient has not been seen in over a year

## 2019-04-29 ENCOUNTER — Other Ambulatory Visit: Payer: Self-pay

## 2019-04-29 ENCOUNTER — Ambulatory Visit (INDEPENDENT_AMBULATORY_CARE_PROVIDER_SITE_OTHER): Payer: BC Managed Care – PPO | Admitting: Pediatrics

## 2019-04-29 ENCOUNTER — Encounter: Payer: Self-pay | Admitting: Pediatrics

## 2019-04-29 VITALS — Ht 64.0 in | Wt 100.0 lb

## 2019-04-29 DIAGNOSIS — Z0001 Encounter for general adult medical examination with abnormal findings: Secondary | ICD-10-CM | POA: Diagnosis not present

## 2019-04-29 DIAGNOSIS — K921 Melena: Secondary | ICD-10-CM

## 2019-04-29 DIAGNOSIS — G40309 Generalized idiopathic epilepsy and epileptic syndromes, not intractable, without status epilepticus: Secondary | ICD-10-CM | POA: Diagnosis not present

## 2019-04-29 DIAGNOSIS — Q939 Deletion from autosomes, unspecified: Secondary | ICD-10-CM

## 2019-04-29 DIAGNOSIS — G808 Other cerebral palsy: Secondary | ICD-10-CM

## 2019-04-29 DIAGNOSIS — Z0000 Encounter for general adult medical examination without abnormal findings: Secondary | ICD-10-CM

## 2019-04-29 MED ORDER — SILVER SULFADIAZINE 1 % EX CREA: 1.0000 "application " | TOPICAL_CREAM | Freq: Every day | CUTANEOUS | 12 refills | Status: AC

## 2019-04-29 MED ORDER — MUPIROCIN 2 % EX OINT: TOPICAL_OINTMENT | CUTANEOUS | 12 refills | Status: AC

## 2019-04-29 NOTE — Progress Notes (Signed)
   History of Present Illness Main concerns today are: ADULT GI referral--intermittent bloody stools  Dental cleaning --[facility] --needs sedation for cleaning   Developmental History Developmental delay Seizures Spastic quadriplegia   Function: Mobility: manual wheelchair Pain concerns: no Hand function:  Right: reduced dexterity  Left: reduced dexterity Spine curvature: mild Swallowing: normal and modified diet (pureed) Toileting: dependent   Equipment:  Wheelchair AFO's Hand splints Patent examiner out chair Suction tubes and suction machine Nebulizer Pulse ox Oxygen tubes and tank Diapers Wipes and gloves G -tube button with Tubings/stand  DIET--Liquid Hope 1 pouch 3.5 times a day--517mls  Oral feeds--Pureed--NECTAR Full liquid  Therapy at school--Speech/OT/PT   Specialists- Neuro-DR Hickling GI--needs new adult ENT --Dr Janace Hoard Ortho-Dr Neldon Mc Pulmonary-N/A Cardio--N/A Endocrinology--N/A Dental--Needs one Ophthal--Dr Annamaria Boots Urology--N/A Surgeon--N/A   Review of Systems: stable  The following portions of the patient's history were reviewed and updated as appropriate: allergies, current medications, past family history, past medical history, past social history, past surgical history and problem list.  Referrals needed-- Adult GI Dentist for special needs  Objective:    Physical Exam  Cognition: non-interactive Respiratory: normal, no increased effort Lower extremity function:AFO's Abdomen: normal-- G tube present Spine scoliosis: mild --post surgery Sitting Ability: assisted Gait: wheelchair    Assessment:    Annual physical exam   Plan:    1. Gross motor: delayed 2. Fine motor/ADL: delayed 3. Educational/vocational: [employer] 4. Transition skills: n/a 5. Speech/swallowing: no speech, GERD 6. Orthopedics/bracing: bilateral AFO's --present today 7. Other equipment:  bath chair, gait trainer, hand/wrist  splint(s), life system, stander, walker, wheelchair and RAMP for house.

## 2019-04-29 NOTE — Patient Instructions (Signed)
Seizure, Adult °A seizure is a sudden burst of abnormal electrical activity in the brain. Seizures usually last from 30 seconds to 2 minutes. They can cause many different symptoms. °Usually, seizures are not harmful unless they last a long time. °What are the causes? °Common causes of this condition include: °· Fever or infection. °· Conditions that affect the brain, such as: °? A brain abnormality that you were born with. °? A brain or head injury. °? Bleeding in the brain. °? A tumor. °? Stroke. °? Brain disorders such as autism or cerebral palsy. °· Low blood sugar. °· Conditions that are passed from parent to child (are inherited). °· Problems with substances, such as: °? Having a reaction to a drug or a medicine. °? Suddenly stopping the use of a substance (withdrawal). °In some cases, the cause may not be known. A person who has repeated seizures over time without a clear cause has a condition called epilepsy. °What increases the risk? °You are more likely to get this condition if you have: °· A family history of epilepsy. °· Had a seizure in the past. °· A brain disorder. °· A history of head injury, lack of oxygen at birth, or strokes. °What are the signs or symptoms? °There are many types of seizures. The symptoms vary depending on the type of seizure you have. Examples of symptoms during a seizure include: °· Shaking (convulsions). °· Stiffness in the body. °· Passing out (losing consciousness). °· Head nodding. °· Staring. °· Not responding to sound or touch. °· Loss of bladder control and bowel control. °Some people have symptoms right before and right after a seizure happens. °Symptoms before a seizure may include: °· Fear. °· Worry (anxiety). °· Feeling like you may vomit (nauseous). °· Feeling like the room is spinning (vertigo). °· Feeling like you saw or heard something before (déjà vu). °· Odd tastes or smells. °· Changes in how you see. You may see flashing lights or spots. °Symptoms after a  seizure happens can include: °· Confusion. °· Sleepiness. °· Headache. °· Weakness on one side of the body. °How is this treated? °Most seizures will stop on their own in under 5 minutes. In these cases, no treatment is needed. Seizures that last longer than 5 minutes will usually need treatment. Treatment can include: °· Medicines given through an IV tube. °· Avoiding things that are known to cause your seizures. These can include medicines that you take for another condition. °· Medicines to treat epilepsy. °· Surgery to stop the seizures. This may be needed if medicines do not help. °Follow these instructions at home: °Medicines °· Take over-the-counter and prescription medicines only as told by your doctor. °· Do not eat or drink anything that may keep your medicine from working, such as alcohol. °Activity °· Do not do any activities that would be dangerous if you had another seizure, like driving or swimming. Wait until your doctor says it is safe for you to do them. °· If you live in the [place] ask your local DMV (department of motor vehicles) when you can drive. °· Get plenty of rest. °Teaching others °Teach friends and family what to do when you have a seizure. They should: °· Lay you on the ground. °· Protect your head and body. °· Loosen any tight clothing around your neck. °· Turn you on your side. °· Not hold you down. °· Not put anything into your mouth. °· Know whether or not you need emergency care. °· Stay   with you until you are better. ° °General instructions °· Contact your doctor each time you have a seizure. °· Avoid anything that gives you seizures. °· Keep a seizure diary. Write down: °? What you think caused each seizure. °? What you remember about each seizure. °· Keep all follow-up visits as told by your doctor. This is important. °Contact a doctor if: °· You have another seizure. °· You have seizures more often. °· There is any change in what happens during your seizures. °· You keep having  seizures with treatment. °· You have symptoms of being sick or having an infection. °Get help right away if: °· You have a seizure that: °? Lasts longer than 5 minutes. °? Is different than seizures you had before. °? Makes it harder to breathe. °? Happens after you hurt your head. °· You have any of these symptoms after a seizure: °? Not being able to speak. °? Not being able to use a part of your body. °? Confusion. °? A bad headache. °· You have two or more seizures in a row. °· You do not wake up right after a seizure. °· You get hurt during a seizure. °These symptoms may be an emergency. Do not wait to see if the symptoms will go away. Get medical help right away. Call your local emergency services (911 in the [place] Do not drive yourself to the hospital. °Summary °· Seizures usually last from 30 seconds to 2 minutes. Usually, they are not harmful unless they last a long time. °· Do not eat or drink anything that may keep your medicine from working, such as alcohol. °· Teach friends and family what to do when you have a seizure. °· Contact your doctor each time you have a seizure. °This information is not intended to replace advice given to you by your health care provider. Make sure you discuss any questions you have with your health care provider. °Document Revised: 03/20/2018 Document Reviewed: 03/20/2018 °Elsevier Patient Education © 2020 Elsevier Inc. ° °

## 2019-05-04 ENCOUNTER — Other Ambulatory Visit: Payer: Self-pay | Admitting: Pediatrics

## 2019-05-04 NOTE — Progress Notes (Signed)
Faxed referral form to [facility] [phone]. They will contact mother for an appt

## 2019-05-04 NOTE — Addendum Note (Signed)
Addended by: Marva Panda on: 05/04/2019 05:11 PM   Modules accepted: Orders

## 2019-05-05 ENCOUNTER — Encounter (INDEPENDENT_AMBULATORY_CARE_PROVIDER_SITE_OTHER): Payer: Self-pay | Admitting: Pediatrics

## 2019-05-05 ENCOUNTER — Telehealth (INDEPENDENT_AMBULATORY_CARE_PROVIDER_SITE_OTHER): Payer: BC Managed Care – PPO | Admitting: Pediatrics

## 2019-05-05 DIAGNOSIS — G8250 Quadriplegia, unspecified: Secondary | ICD-10-CM | POA: Diagnosis not present

## 2019-05-05 DIAGNOSIS — G40309 Generalized idiopathic epilepsy and epileptic syndromes, not intractable, without status epilepticus: Secondary | ICD-10-CM | POA: Diagnosis not present

## 2019-05-05 DIAGNOSIS — Q043 Other reduction deformities of brain: Secondary | ICD-10-CM

## 2019-05-05 DIAGNOSIS — F73 Profound intellectual disabilities: Secondary | ICD-10-CM | POA: Diagnosis not present

## 2019-05-05 NOTE — Progress Notes (Signed)
This is a Pediatric Specialist E-Visit follow up consult provided via [place] and their parent/guardian [name] consented to an E-Visit consult today.  Location of patient: [name] is at home Location of provider: Sherron Rivers is in office Patient was referred by Maxwell Solders, MD   The following participants were involved in this E-Visit: patient, mother, CMA, provider  Chief Complaint/ Reason for E-Visit today: Epilepsy Total time on call: 25 minutes Follow up: 1 year    Patient: [name] MRN: [id number] Sex: male DOB: 1995/09/17  Provider: Wyline Copas, MD Location of Care: [facility]  Note type: Routine return visit  History of Present Illness: Referral Source: Maxwell Solders, MD History from: mother, patient and [facility] chart Chief Complaint: Epilepsy  [name] is a 24 y.o. male who was evaluated virtually May 05, 2019 for the first time since February 03, 2018.  He has a complex cerebral malformation with pachygyria, agenesis of the corpus callosum, hydrocephalus ex vacuo.  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  He has spontaneous and stimulus sensitive myoclonus.  In mid November, 2019 he had a cluster of generalized tonic-clonic seizures.  These are manifest by turning of his his head to the left staring upwards there may or may not be movements in association with that.  It tends to and with a myoclonic jerk.  He has been treated surgically with procedures on his hips to help locate them, and a Harrington rod procedure.  Seizures occur as often as 1-2 times per week or sometimes none for more than a week at a time.  They are brief lasting 30 seconds to less than a minute.  He  has not required Diastat.  He has desaturation.  His lips turned purple and his mother gives him supplemental oxygen oxygen saturations have dropped to as low as 52%.  Fortunately they are brief and as soon as the seizure stops he regains his color and his oxygen saturation improves.  In general he does not sleep well.  On those nights when he does not, he will sleep late.  His parents allow him to do that because there is no reason not to.  He is fed by gastrostomy.  His parents change out his button every 3 months.  In general his health is good.  No family member has developed Covid.  His father is an Chief Technology Officer.  His sister attends high school.  His family is hesitant to get the coronavirus vaccine.  Talk with mother briefly about it and will send some information concerning the vaccines.  I am very concerned about how [name] might respond if he got sick with coronavirus.  Since he is young, there may be no problem at all, but if he developed a significant pneumonia it would be a very serious situation.  Review of Systems: A complete review of systems was remarkable for patient is here to be seen for epilepsy. Mom reports that the patient has seizures sporadically out of the month. She states he can go one week without having any to the next week, having multiple. She reports that the seizures do not last more than three minutes. She states that she ahs to administer his oxygen every third or fourth time he has a seizure. She states that it is no  consistency in the seizures or the oxygen use. She reports no other concerns a , all other systems reviewed and negative.  Past Medical History Diagnosis Date  . Cerebral palsy ([place])   . G tube feedings ([place])   . 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 Hematuria, gross 12/25/2010  . Seizures ([place])    Hospitalizations: No., Head Injury: No., Nervous System Infections: No., Immunizations up to date: Yes.    Copied from prior chart 14q12 deletion  disorder that was discovered on chromosomal micro array at [facility].  MRI of the brain December 11, 2000 shows agenesis of the corpus callosum, hydrocephalus ex vacuo, and coarse gyral configuration suggesting pachygyria.   He has spastic quadriparesis, severe dysphagia requiring feeding by percutaneous gastrostomy, cortical visual impairment, and severe cognitive impairment. 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 He has had multiple operations on his hips and a Harrington rod procedure.  I saw him because of spontaneous and stimulus-sensitive myoclonus. He has also had episodes of eye deviation, jaw thrusting, and myoclonic movements that suggested the possibility of seizures.  Routine EEG on November 22, 2011 showed diffuse background slowing without seizures.  He was unable to cooperate for sleep study in [place] and would not keep the leads on his head, face, or body. January, 2015  He had a polysomnogram at [facility] on May 15, 2013, that failed to show significant issues of arousal either with apnea or periodic limb movements. There were no seizures noted.   Birth History 7 lbs. 9 oz. Infant born at [redacted] weeks gestational age to a g 2 p 1 0 0 1 male.  Gestation was uncomplicated  Mother received Epidural anesthesia normal spontaneous vaginal delivery  Nursery Course was uncomplicated  Growth and Development was recalled and recorded as globally delayed  Behavior History none  Surgical History Procedure Laterality Date  . CIRCUMCISION  1999  . GASTROSTOMY TUBE PLACEMENT  2008  . HIP SURGERY  2010  . SPINAL FUSION  August 2014   Family History family history includes Diabetes in his paternal grandfather; Hyperlipidemia in his paternal grandmother; Hypertension in his paternal grandfather. Family history is negative for migraines, seizures, intellectual disabilities, blindness, deafness, birth defects, chromosomal disorder, or  autism.  Social History Socioeconomic History  . Marital status: Single  . Years of education:  60  . Highest education level: High school senior  Occupational History  . Not employed due to disability  Tobacco Use  . Smoking status: Never Smoker  . Smokeless tobacco: Never Used  Substance and Sexual Activity  . Alcohol use: No  . Drug use: No  . 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 He lives with his parents and siblings.    Allergies Allergen Reactions  . Cephalosporins Hives    Neck and face  . Cefazolin Hives  . Cephalexin Hives   Physical Exam There were no vitals taken for this visit.  General: alert, well developed, well nourished, in no acute distress, brown hair, brown eyes, non-handed Head: microcephalic, no dysmorphic features Neck: supple, full range of motion Musculoskeletal: no skeletal deformities repaired scoliosis Skin: no rashes or neurocutaneous lesions  Neurologic Exam  Mental Status: alert; not able to interact with the examiner Cranial Nerves: Unable to test Motor: Spastic quadriplegia  Assessment 1.  Generalized nonconvulsive epilepsy, G40.309. 2.  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 3.  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 4.  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 5.  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  Discussion Despite his difficulties, [place] is stable.  There is no reason to change his levetiracetam at this time.  Plan Prescription will be sent for levetiracetam.  I will also send information concerning the coronavirus for the family to review.  I strongly advocate that he receive the immunization as well as other family members.  As long as things are stable, we will plan to see him in a year.  His visit was virtual today because one of the siblings has a strep throat.  Greater than 50% of a 25-minute visit was spent in counseling and coordination of care concerning his  seizures, his other underlying neurologic disorder, and discussing coronavirus vaccine.   Medication List   Accurate as of May 05, 2019  2:57 PM. If you have any questions, ask your nurse or doctor.    albuterol (2.5 MG/3ML) 0.083% nebulizer solution Commonly known as: PROVENTIL Take 3 mLs (2.5 mg total) by nebulization every 6 (six) hours as needed for wheezing or shortness of breath.   budesonide 0.5 MG/2ML nebulizer solution Commonly known as: PULMICORT Take 2 mLs (0.5 mg total) by nebulization daily.   diazepam 10 MG Gel Commonly known as: Diastat AcuDial Pharmacist dial to 7.5mg  Sig: Give 7.5mg  rectally for seizure lasting 2 minutes or longer. What changed:   how much to take  how to take this  when to take this  reasons to take this  additional instructions   lansoprazole 30 MG capsule Commonly known as: PREVACID Take 1 capsule (30 mg total) by mouth daily.   levETIRAcetam 100 MG/ML solution Commonly known as: KEPPRA TAKE 10 MILLILITERS BY MOUTH TWICE A DAY   levocetirizine 2.5 MG/5ML solution Commonly known as: XYZAL TAKE TEN MILLILITERS BY MOUTH EACH EVENING   loratadine 5 MG/5ML syrup Commonly known as: Claritin Take 10 mLs (10 mg total) by mouth daily for 28 days. What changed:   how much to take  how to take this   montelukast 10 MG tablet Commonly known as: SINGULAIR TAKE ONE TABLET BY MOUTH AT BEDTIME   mupirocin ointment 2 % Commonly known as: BACTROBAN Apply to affected area 3 times daily   silver sulfADIAZINE 1 % cream Commonly known as: SILVADENE Apply 1 application topically daily for 7 days.    The medication list was reviewed and reconciled. All changes or newly prescribed medications were explained.  A complete medication list was provided to the patient/caregiver.  Jodi Geralds MD

## 2019-05-06 MED ORDER — LEVETIRACETAM 100 MG/ML PO SOLN: ORAL | 5 refills | Status: DC

## 2019-05-06 NOTE — Patient Instructions (Addendum)
It was a pleasure to see you today.  I am glad that [name] is stable.  We will refill his prescription for levetiracetam and plan to see him in a year.  I am going to send information concerning coronavirus.  As I indicated to you, I am very concerned that should he become ill with coronavirus that he might get very sick.  You are well aware that younger people tend to have more asymptomatic infections that seem to spread the coronavirus more easily.  I know that you have been careful, but his father and his sister have to leave the home into the general public and therefore are at risk of bringing the infection home to [place].  If I can be of assistance in the upcoming year, please do not hesitate to contact me.  We can consider changing his Diastat to [facility] when the rectal gel expires in October of this year.

## 2019-05-07 ENCOUNTER — Encounter (INDEPENDENT_AMBULATORY_CARE_PROVIDER_SITE_OTHER): Payer: Self-pay | Admitting: Pediatrics

## 2019-05-23 ENCOUNTER — Other Ambulatory Visit: Payer: Self-pay | Admitting: Pediatrics

## 2019-06-30 ENCOUNTER — Encounter (INDEPENDENT_AMBULATORY_CARE_PROVIDER_SITE_OTHER): Payer: Self-pay | Admitting: Pediatrics

## 2019-06-30 ENCOUNTER — Telehealth (INDEPENDENT_AMBULATORY_CARE_PROVIDER_SITE_OTHER): Payer: Self-pay | Admitting: Pediatrics

## 2019-06-30 NOTE — Telephone Encounter (Signed)
  Who's calling (name and relationship to patient) : Selinda Eon, mother and guardian  Best contact number: [phone]  Provider they see: Gaynell Face  Reason for call: Stated Keppra changed from 8mg  to 10mg . Mother needs documentation of when this dose increased to provide to patient's nursing services. Mother requested we send this to [facility]. [address] [place] [address]. Please put attention Phyllis.      PRESCRIPTION REFILL ONLY  Name of prescription:  Pharmacy:

## 2019-06-30 NOTE — Telephone Encounter (Signed)
Letter has been written, will be printed and signed tomorrow and ready for disposition

## 2019-09-02 ENCOUNTER — Telehealth: Payer: Self-pay | Admitting: Pediatrics

## 2019-09-02 NOTE — Telephone Encounter (Signed)
Physician/Mental Health Professional Statement form left on Dr. Enid Derry desk.

## 2019-09-06 ENCOUNTER — Other Ambulatory Visit: Payer: Self-pay | Admitting: Pediatrics

## 2019-09-13 ENCOUNTER — Other Ambulatory Visit: Payer: Self-pay | Admitting: Pediatrics

## 2019-09-13 MED ORDER — ALBUTEROL SULFATE (2.5 MG/3ML) 0.083% IN NEBU: 2.5000 mg | INHALATION_SOLUTION | Freq: Four times a day (QID) | RESPIRATORY_TRACT | 12 refills | Status: DC | PRN

## 2019-09-13 MED ORDER — BUDESONIDE 0.5 MG/2ML IN SUSP: 0.5000 mg | Freq: Every day | RESPIRATORY_TRACT | 12 refills | Status: DC

## 2019-10-13 ENCOUNTER — Telehealth: Payer: Self-pay

## 2019-10-13 NOTE — Telephone Encounter (Signed)
Mom called and needs a letter saying [name] is out on [facility] for the rest of the school year but will be revisited ever 6 weeks sent to [place] please. Mom does not want him in school right now because of Covid. Any questions please call mom

## 2019-10-20 ENCOUNTER — Telehealth: Payer: Self-pay | Admitting: Pediatrics

## 2019-10-20 NOTE — Telephone Encounter (Signed)
Child medical report filled  

## 2019-12-08 ENCOUNTER — Other Ambulatory Visit: Payer: Self-pay | Admitting: Pediatrics

## 2020-01-16 ENCOUNTER — Other Ambulatory Visit (INDEPENDENT_AMBULATORY_CARE_PROVIDER_SITE_OTHER): Payer: Self-pay | Admitting: Pediatrics

## 2020-01-16 DIAGNOSIS — G40309 Generalized idiopathic epilepsy and epileptic syndromes, not intractable, without status epilepticus: Secondary | ICD-10-CM

## 2020-01-17 NOTE — Telephone Encounter (Signed)
Please send to the pharmacy °

## 2020-02-14 ENCOUNTER — Telehealth (INDEPENDENT_AMBULATORY_CARE_PROVIDER_SITE_OTHER): Payer: Self-pay

## 2020-02-14 NOTE — Telephone Encounter (Signed)
We discussed this patient.  I agree with the advice that she gave the mother.  We will not be able to find out what has taken place until the chart is signed.

## 2020-02-14 NOTE — Telephone Encounter (Signed)
Spoke with mom about the patient. Mom states that the patient ended up on his stomach and arms last night. She states that his face is swollen and there was blood on the sheets. She states that he must have rubbed his face back and forth last night. She states that his pulse was 150-160 and his temperature was high. She reports that she wanted to know what hospital to take him to, [facility] or [facility]. I informed her that it was her preference.

## 2020-05-16 ENCOUNTER — Ambulatory Visit: Payer: BC Managed Care – PPO | Admitting: Pediatrics

## 2020-05-16 ENCOUNTER — Telehealth: Payer: Self-pay

## 2020-05-16 NOTE — Telephone Encounter (Signed)
Parent informed of No Show Policy. No Show Policy states that a patient may be dismissed from the practice after 3 missed well check appointments in a rolling calendar year. No show appointments are well child check appointments that are missed (no show or cancelled/rescheduled < 24hrs prior to appointment). The parent(s)/guardian will be notified of each missed appointment. The office administrator will review the chart prior to a decision being made. If a patient is dismissed due to No Shows, [facility] will continue to see that patient for 30 days for sick visits. Parent/caregiver verbalized understanding of policy.    They had a "mishap"

## 2020-05-18 ENCOUNTER — Encounter (INDEPENDENT_AMBULATORY_CARE_PROVIDER_SITE_OTHER): Payer: Self-pay

## 2020-06-01 ENCOUNTER — Other Ambulatory Visit: Payer: Self-pay | Admitting: Pediatrics

## 2020-06-01 ENCOUNTER — Telehealth: Payer: Self-pay | Admitting: Pediatrics

## 2020-06-01 MED ORDER — ONDANSETRON HCL 4 MG/5ML PO SOLN: 8.0000 mg | Freq: Three times a day (TID) | ORAL | 3 refills | Status: DC | PRN

## 2020-06-01 MED ORDER — BUDESONIDE 0.5 MG/2ML IN SUSP: 0.5000 mg | Freq: Every day | RESPIRATORY_TRACT | 12 refills | Status: DC

## 2020-06-01 NOTE — Telephone Encounter (Signed)
Mom called with him having tested positive for COVID-unvaccinated and is okay except for poor feeding tolerance. No respiratory issues. Mom aware of what signs she should look for to take to ER. Advised mom I will cal in refills of budesonide and zofran but if he gets worse to take him to ER for further treatment.

## 2020-06-05 ENCOUNTER — Emergency Department (HOSPITAL_BASED_OUTPATIENT_CLINIC_OR_DEPARTMENT_OTHER)
Admission: EM | Admit: 2020-06-05 | Discharge: 2020-06-05 | Disposition: A | Discharge status: Home / Self Care | Payer: BC Managed Care – PPO | Attending: Emergency Medicine | Admitting: Emergency Medicine

## 2020-06-05 ENCOUNTER — Encounter (HOSPITAL_BASED_OUTPATIENT_CLINIC_OR_DEPARTMENT_OTHER): Payer: Self-pay | Admitting: Obstetrics and Gynecology

## 2020-06-05 ENCOUNTER — Telehealth: Payer: Self-pay | Admitting: Pediatrics

## 2020-06-05 ENCOUNTER — Other Ambulatory Visit: Payer: Self-pay

## 2020-06-05 DIAGNOSIS — R059 Cough, unspecified: Secondary | ICD-10-CM | POA: Diagnosis not present

## 2020-06-05 DIAGNOSIS — R1110 Vomiting, unspecified: Secondary | ICD-10-CM | POA: Diagnosis not present

## 2020-06-05 LAB — CBC
HCT: 42.6 % (ref 39.0–52.0)
Hemoglobin: 14.2 g/dL (ref 13.0–17.0)
MCH: 29.5 pg (ref 26.0–34.0)
MCHC: 33.3 g/dL (ref 30.0–36.0)
MCV: 88.4 fL (ref 80.0–100.0)
Platelets: 128 10*3/uL — ABNORMAL LOW (ref 150–400)
RBC: 4.82 MIL/uL (ref 4.22–5.81)
RDW: 12.6 % (ref 11.5–15.5)
WBC: 5.2 10*3/uL (ref 4.0–10.5)
nRBC: 0 % (ref 0.0–0.2)

## 2020-06-05 LAB — COMPREHENSIVE METABOLIC PANEL
ALT: 60 U/L — ABNORMAL HIGH (ref 0–44)
AST: 67 U/L — ABNORMAL HIGH (ref 15–41)
Albumin: 4.1 g/dL (ref 3.5–5.0)
Alkaline Phosphatase: 50 U/L (ref 38–126)
Anion gap: 9 (ref 5–15)
BUN: 13 mg/dL (ref 6–20)
CO2: 33 mmol/L — ABNORMAL HIGH (ref 22–32)
Calcium: 9 mg/dL (ref 8.9–10.3)
Chloride: 104 mmol/L (ref 98–111)
Creatinine, Ser: 0.36 mg/dL — ABNORMAL LOW (ref 0.61–1.24)
GFR, Estimated: >60 mL/min (ref >60)
Glucose, Bld: 93 mg/dL (ref 70–99)
Potassium: 4.7 mmol/L (ref 3.5–5.1)
Sodium: 146 mmol/L — ABNORMAL HIGH (ref 135–145)
Total Bilirubin: 0.4 mg/dL (ref 0.3–1.2)
Total Protein: 7.6 g/dL (ref 6.5–8.1)

## 2020-06-05 LAB — LIPASE, BLOOD: Lipase: <10 U/L — ABNORMAL LOW (ref 11–51)

## 2020-06-05 MED ORDER — METOCLOPRAMIDE HCL 5 MG/5ML PO SOLN: 10.0000 mg | Freq: Three times a day (TID) | ORAL | 0 refills | Status: DC | PRN

## 2020-06-05 MED ORDER — SODIUM CHLORIDE 0.9 % IV BOLUS
1000.0000 mL | Freq: Once | INTRAVENOUS | Status: AC
  Administered 2020-06-05: 1000 mL via INTRAVENOUS

## 2020-06-05 MED ORDER — FAMOTIDINE IN NACL 20-0.9 MG/50ML-% IV SOLN
20.0000 mg | Freq: Once | INTRAVENOUS | Status: AC
  Administered 2020-06-05: 20 mg via INTRAVENOUS
  Filled 2020-06-05: qty 50

## 2020-06-05 MED ORDER — PANTOPRAZOLE SODIUM 40 MG PO PACK: 40.0000 mg | PACK | Freq: Every day | ORAL | 0 refills | Status: DC

## 2020-06-05 MED ORDER — ONDANSETRON HCL 4 MG/2ML IJ SOLN
4.0000 mg | Freq: Once | INTRAMUSCULAR | Status: AC
Start: 2020-06-05 — End: 2020-06-05
  Administered 2020-06-05: 4 mg via INTRAVENOUS
  Filled 2020-06-05: qty 2

## 2020-06-05 NOTE — ED Notes (Signed)
Emesis x 6 today started after mother advanced back to full feeding diet. Pt has had 2 wet diapers today, which mother states is slightly less than normal. Pt is alert and interactive during exam. NAD.

## 2020-06-05 NOTE — Telephone Encounter (Signed)
Mom called and said that [name] has covid and now he is vomiting very badly. Requested his Prevacid be refilled. Stated she may be taking him to the Emergency Room because he can't keep [place] down.  [employer] for the Prevacid refill.

## 2020-06-05 NOTE — ED Triage Notes (Signed)
Patient reports to the ER for emesis after COVID. Patient reportedly has not been able to hold down his seizure medication and normal medication.

## 2020-06-05 NOTE — ED Provider Notes (Signed)
[place] EMERGENCY DEPT Provider Note   CSN: [id number] Arrival date & time: 06/05/20  1644  LEVEL 5 CAVEAT - NONVERBAL History Chief Complaint  Patient presents with  . Emesis    Donnelle Olmeda is a 25 y.o. male.  HPI 25 year old male presents with vomiting.  History is from mom.  The patient had COVID starting 7 days ago.  The fevers are better.  Some residual cough.  However he had a little bit of vomiting during COVID that seem to get better but then came back yesterday.  He has vomited at least 8 times.  No blood but it went from clear to a little bit of yellow.  He does not seem to be in pain, which mom can usually tell.  No further fevers.  Given Zofran this morning but still vomiting.  He has been out of his Prevacid for several days and she wonders if this is contributing.  Is still waiting on it to be compounded for his G-tube.  He has a history of GERD.  Often whenever he gets any type of illness that causes vomiting he has a prolonged course and takes a while to recover.  Past Medical History:  Diagnosis Date  . Cerebral palsy ([place])   . G tube feedings ([place])   . 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 Hematuria, gross 12/25/2010  . Seizures [facility])     Patient Active Problem List   Diagnosis Date Noted  . Irritant dermatitis 02/28/2017  . Need for prophylactic vaccination and inoculation against influenza 12/19/2016  . Annual physical exam 11/30/2015  . 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 Cerebral palsy ([place]) 11/02/2012  . Incontinence of urine 05/27/2012  . 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 CIRCUMCISION  1999  . GASTROSTOMY TUBE PLACEMENT  2008  . HIP SURGERY  2010  . SPINAL FUSION  August 2014       Family History  Problem Relation Age of Onset  . Hyperlipidemia Paternal Grandmother   . Diabetes Paternal Grandfather   . Hypertension Paternal Grandfather   . Alcohol abuse Neg Hx   . Arthritis Neg Hx   . Asthma Neg Hx   . Birth defects Neg Hx   . Cancer Neg Hx   . COPD Neg Hx   . Depression Neg Hx   . Drug abuse Neg Hx   . Early death Neg Hx   . Hearing loss Neg Hx   . Heart disease Neg Hx   . Kidney disease Neg Hx   . Learning disabilities Neg Hx   . Mental illness Neg Hx   . Stroke Neg Hx   . Miscarriages / Stillbirths Neg Hx   . Mental retardation Neg Hx   . Vision loss Neg Hx   . Varicose Veins Neg Hx     Social History   Tobacco Use  . Smoking status: Never Smoker  . Smokeless tobacco: Never Used  Vaping Use  . Vaping Use: Never used  Substance Use Topics  . Alcohol use: No  . Drug use: No    Home Medications Prior to Admission medications  Medication Sig Start Date End Date Taking? Authorizing Provider  metoCLOPramide (REGLAN) 5 MG/5ML solution Take 10 mLs (10 mg total) by mouth every 8 (eight) hours as needed for nausea. 06/05/20  Yes Sherwood Gambler, MD  pantoprazole sodium (PROTONIX) 40 mg/20 mL PACK Place 20 mLs (40 mg total) into feeding tube daily for 7 days. 06/05/20 06/12/20 Yes Sherwood Gambler, MD  albuterol (PROVENTIL) (2.5 MG/3ML) 0.083% nebulizer solution Take 3 mLs (2.5 mg total) by nebulization every 6 (six) hours as needed for wheezing or shortness of breath. 09/13/19 10/13/19  [name], MD  budesonide (PULMICORT) 0.5 MG/2ML nebulizer solution Take 2 mLs (0.5 mg total) by nebulization daily. 06/01/20   [name], MD  diazepam (DIASTAT ACUDIAL) 10 MG GEL Pharmacist dial to 7.5mg  Sig: Give 7.5mg  rectally for seizure lasting 2 minutes  or longer. Patient taking differently: Place 7.5 mg rectally as needed for seizure.  09/07/15   Rockwell Germany, NP  levETIRAcetam (KEPPRA) 100 MG/ML solution TAKE 10MLS BY MOUTH TWO TIMES A DAY 01/17/20   Jodi Geralds, MD  levocetirizine (XYZAL) 2.5 MG/5ML solution TAKE TEN MILLILITERS BY MOUTH EACH EVENING 05/04/19   [name], MD  loratadine (CLARITIN) 5 MG/5ML syrup Take 10 mLs (10 mg total) by mouth daily for 28 days. Patient taking differently: Place 5 mg into feeding tube daily.  02/27/17 12/28/25  [name], MD  montelukast (SINGULAIR) 10 MG tablet TAKE ONE TABLET BY MOUTH AT BEDTIME 12/08/19   Klett, Rodman Pickle, NP  ondansetron [facility]) 4 MG/5ML solution Take 10 mLs (8 mg total) by mouth every 8 (eight) hours as needed for up to 7 days for nausea or vomiting. 06/01/20 06/08/20  [name], MD    Allergies    Cephalosporins, Cefazolin, and Cephalexin  Review of Systems   Review of Systems  Unable to perform ROS: Patient nonverbal    Physical Exam Updated Vital Signs BP 111/70 (BP Location: Left Arm)   Pulse 99   Temp 98.3 F (36.8 C) (Axillary)   Resp 18   SpO2 99%   Physical Exam Vitals and nursing note reviewed.  Constitutional:      General: He is not in acute distress.    Appearance: He is well-developed. He is not ill-appearing or diaphoretic.  HENT:     Head: Normocephalic and atraumatic.     Right Ear: External ear normal.     Left Ear: External ear normal.     Nose: Nose normal.  Eyes:     General:        Right eye: No discharge.        Left eye: No discharge.  Cardiovascular:     Rate and Rhythm: Normal rate and regular rhythm.     Heart sounds: Normal heart sounds.  Pulmonary:     Effort: Pulmonary effort is normal.  Abdominal:     General: There is no distension.     Palpations: Abdomen is soft.     Tenderness: There is no abdominal tenderness.  Musculoskeletal:     Cervical back: Neck supple.  Skin:    General: Skin is warm  and dry.  Neurological:     Mental Status: He is alert.  Psychiatric:        Mood and Affect: Mood is not anxious.     ED Results / Procedures / Treatments   Labs (all labs ordered are listed, but only abnormal results are displayed) Labs Reviewed  COMPREHENSIVE METABOLIC PANEL - Abnormal; Notable for the following  components:      Result Value   Sodium 146 (*)    CO2 33 (*)    Creatinine, Ser 0.36 (*)    AST 67 (*)    ALT 60 (*)    All other components within normal limits  CBC - Abnormal; Notable for the following components:   Platelets 128 (*)    All other components within normal limits  LIPASE, BLOOD - Abnormal; Notable for the following components:   Lipase <10 (*)    All other components within normal limits    EKG None  Radiology No results found.  Procedures Procedures   Medications Ordered in ED Medications  sodium chloride 0.9 % bolus 1,000 mL (0 mLs Intravenous Stopped 06/05/20 2037)  ondansetron (ZOFRAN) injection 4 mg (4 mg Intravenous Given 06/05/20 1928)  famotidine (PEPCID) IVPB 20 mg premix (0 mg Intravenous Stopped 06/05/20 2037)    ED Course  I have reviewed the triage vital signs and the nursing notes.  Pertinent labs & imaging results that were available during my care of the patient were reviewed by me and considered in my medical decision making (see chart for details).    MDM Rules/Calculators/A&P                          History is obviously pretty limited based on his cerebral palsy.  However he is not ill-appearing and has stable vital signs.  I offered imaging including chest and abdominal imaging given the vomiting and the lack of communication but mom declines at this point.  He is probably a little dehydrated with his mildly bumped sodium.  He was given fluids and Zofran.  Given no further vomiting I think is reasonable to put on PPI and adjust nausea meds and try liquid Reglan as needed and have him follow-up with his PCP.  Mom is in  agreement with plan.  Return precautions given. Final Clinical Impression(s) / ED Diagnoses Final diagnoses:  Vomiting in adult    Rx / DC Orders ED Discharge Orders         Ordered    metoCLOPramide (REGLAN) 5 MG/5ML solution  Every 8 hours PRN        06/05/20 2024    pantoprazole sodium (PROTONIX) 40 mg/20 mL PACK  Daily        06/05/20 2024           Sherwood Gambler, MD 06/05/20 2339

## 2020-06-05 NOTE — Discharge Instructions (Addendum)
If he develops new or worsening vomiting, abdominal pain, fevers, or any other new/concerning symptoms then return to the ER for evaluation.

## 2020-06-06 ENCOUNTER — Telehealth: Payer: Self-pay | Admitting: Pediatrics

## 2020-06-06 MED ORDER — ONDANSETRON HCL 4 MG/5ML PO SOLN: 4.0000 mg | Freq: Two times a day (BID) | ORAL | 0 refills | Status: DC

## 2020-06-06 MED ORDER — FAMOTIDINE 40 MG/5ML PO SUSR: 20.0000 mg | Freq: Every day | ORAL | 0 refills | Status: DC

## 2020-06-06 NOTE — Telephone Encounter (Signed)
Mom took him to ER

## 2020-06-07 ENCOUNTER — Telehealth: Payer: Self-pay | Admitting: Pediatrics

## 2020-06-07 MED ORDER — LANSOPRAZOLE 30 MG PO TBDD: 30.0000 mg | DELAYED_RELEASE_TABLET | Freq: Every day | ORAL | 0 refills | Status: DC

## 2020-06-07 NOTE — Telephone Encounter (Signed)
PCP is not in office today.  Will send in for lansoprazole 30mg  ODT to be compounded for gtube daily.

## 2020-06-07 NOTE — Telephone Encounter (Signed)
Pediatric Transition Care Management Follow-up Telephone Call  [facility] Managed Care Transition Call Status:  MM TOC Call Made  Symptoms: Has [name] developed any new symptoms since being discharged from the hospital? No.    Follow Up: Was there a hospital follow up appointment recommended for your child with their PCP? no (not all patients peds need a PCP follow up/depends on the diagnosis)   Do you have the contact number to reach the patient's PCP? yes  Was the patient referred to a specialist? no  If so, has the appointment been scheduled? no  Are transportation arrangements needed? no  If you notice any changes in [name] condition, call their primary care doctor or go to the Emergency Dept.  Do you have any other questions or concerns? Yes. Mother would like a refill of his Prevacid. Mother feels like if he was to get Prevacid in his system it would help with his vomiting and stomach issues. Sent Dr. Carolynn Sayers a phone call for a refill since Dr. Juanell Fairly is not in the office.    SIGNATURE

## 2020-06-07 NOTE — Telephone Encounter (Addendum)
Mother would like a prescription for lansoprazole ODT 30 mg to [facility]. Pharmacy instructions need to say please compound it into liquid for G Tube. Mother did not want to wait until Dr. Juanell Fairly came back into office to get this prescription sent. Mother requested another provider to sent it in so he doesn't have another rough night.

## 2020-06-12 ENCOUNTER — Other Ambulatory Visit: Payer: Self-pay | Admitting: Pediatrics

## 2020-06-12 MED ORDER — LANSOPRAZOLE 30 MG PO TBDD: 30.0000 mg | DELAYED_RELEASE_TABLET | Freq: Every day | ORAL | 12 refills | Status: DC

## 2020-06-15 ENCOUNTER — Encounter: Payer: Self-pay | Admitting: Pediatrics

## 2020-06-15 ENCOUNTER — Other Ambulatory Visit: Payer: Self-pay

## 2020-06-15 ENCOUNTER — Ambulatory Visit (INDEPENDENT_AMBULATORY_CARE_PROVIDER_SITE_OTHER): Payer: BC Managed Care – PPO | Admitting: Pediatrics

## 2020-06-15 VITALS — BP 98/66 | Ht 64.0 in | Wt 90.7 lb

## 2020-06-15 DIAGNOSIS — G808 Other cerebral palsy: Secondary | ICD-10-CM

## 2020-06-15 DIAGNOSIS — Z931 Gastrostomy status: Secondary | ICD-10-CM | POA: Insufficient documentation

## 2020-06-15 DIAGNOSIS — G8250 Quadriplegia, unspecified: Secondary | ICD-10-CM | POA: Diagnosis not present

## 2020-06-15 DIAGNOSIS — K219 Gastro-esophageal reflux disease without esophagitis: Secondary | ICD-10-CM

## 2020-06-15 DIAGNOSIS — Z0001 Encounter for general adult medical examination with abnormal findings: Secondary | ICD-10-CM | POA: Diagnosis not present

## 2020-06-15 DIAGNOSIS — Z0000 Encounter for general adult medical examination without abnormal findings: Secondary | ICD-10-CM

## 2020-06-15 DIAGNOSIS — G40309 Generalized idiopathic epilepsy and epileptic syndromes, not intractable, without status epilepticus: Secondary | ICD-10-CM

## 2020-06-15 NOTE — Addendum Note (Signed)
Addended by: [name] on: 06/15/2020 05:35 PM   Modules accepted: Orders

## 2020-06-15 NOTE — Progress Notes (Signed)
   History of Present Illness Main concerns today are: ADULT GI referral--GERD  Dental cleaning --[facility] --needs sedation for cleaning --had appointment in 2021 but cancelled due to [place]  Adult Ophthalmology Referral ---has not had an eye exam in some time.   Developmental History Developmental delay Seizures Spastic quadriplegia   Function: Mobility: manual wheelchair Pain concerns: no Hand function: Right: reduced dexterity Left: reduced dexterity Spine curvature: mild Swallowing: normal and modified diet (pureed) Toileting: dependent   Equipment:  Wheelchair AFO's Hand splints Patent examiner out chair Suction tubes and suction machine Nebulizer Pulse ox Oxygen tubes and tank Diapers Wipes and gloves G -tube button with Tubings/stand  DIET--Liquid [place] 1 pouch 3.5 times a day--594mls  Oral feeds--Pureed--NECTAR Full liquid  Therapy at school--Speech/OT/PT   Specialists- Neuro-[facility]--needs new adult ENT --n/a Ortho-n/a Pulmonary-N/A Cardio--N/A Endocrinology--N/A Dental--Needs one Ophthal--NEEDS ADULT Urology--N/A Surgeon--N/A   Review of Systems: stable  The following portions of the patient's history were reviewed and updated as appropriate: allergies, current medications, past family history, past medical history, past social history, past surgical history and problem list.  Referrals needed-- Adult GI Dentist for special needs ADULT ophthalmology  Objective:    Physical Exam  Cognition: non-interactive Respiratory: normal, no increased effort Lower extremity function:AFO's Abdomen: normal-- G tube present Spine scoliosis: mild --post surgery Sitting Ability: assisted Gait: wheelchair    Assessment:    Annual physical exam   Plan:    1. Gross motor: delayed 2. Fine motor/ADL: delayed 3. Educational/vocational:n/a 4. Transition skills: n/a 5. Speech/swallowing: no speech, GERD 6.  Orthopedics/bracing: bilateral AFO's --present today 7. Other equipment:  bath chair, gait trainer, hand/wrist splint(s), life system, stander, walker, wheelchair and RAMP for house.  Mom to call for appointment to get Tdap vaccine

## 2020-06-15 NOTE — Patient Instructions (Signed)
Gastroesophageal Reflux Disease, Adult  Gastroesophageal reflux (GER) happens when acid from the stomach flows up into the tube that connects the mouth and the stomach (esophagus). Normally, food travels down the esophagus and stays in the stomach to be digested. With GER, food and stomach acid sometimes move back up into the esophagus. You may have a disease called gastroesophageal reflux disease (GERD) if the reflux:  Happens often.  Causes frequent or very bad symptoms.  Causes problems such as damage to the esophagus. When this happens, the esophagus becomes sore and swollen. Over time, GERD can make small holes (ulcers) in the lining of the esophagus. What are the causes? This condition is caused by a problem with the muscle between the esophagus and the stomach. When this muscle is weak or not normal, it does not close properly to keep food and acid from coming back up from the stomach. The muscle can be weak because of:  Tobacco use.  Pregnancy.  Having a certain type of hernia (hiatal hernia).  Alcohol use.  Certain foods and drinks, such as coffee, chocolate, onions, and peppermint. What increases the risk?  Being overweight.  Having a disease that affects your connective tissue.  Taking NSAIDs, such a ibuprofen. What are the signs or symptoms?  Heartburn.  Difficult or painful swallowing.  The feeling of having a lump in the throat.  A bitter taste in the mouth.  Bad breath.  Having a lot of saliva.  Having an upset or bloated stomach.  Burping.  Chest pain. Different conditions can cause chest pain. Make sure you see your doctor if you have chest pain.  Shortness of breath or wheezing.  A long-term cough or a cough at night.  Wearing away of the surface of teeth (tooth enamel).  Weight loss. How is this treated?  Making changes to your diet.  Taking medicine.  Having surgery. Treatment will depend on how bad your symptoms are. Follow these  instructions at home: Eating and drinking  Follow a diet as told by your doctor. You may need to avoid foods and drinks such as: ? Coffee and tea, with or without caffeine. ? Drinks that contain alcohol. ? Energy drinks and sports drinks. ? Bubbly (carbonated) drinks or sodas. ? Chocolate and cocoa. ? Peppermint and mint flavorings. ? Garlic and onions. ? Horseradish. ? Spicy and acidic foods. These include peppers, chili powder, curry powder, vinegar, hot sauces, and BBQ sauce. ? Citrus fruit juices and citrus fruits, such as oranges, lemons, and limes. ? Tomato-based foods. These include red sauce, chili, salsa, and pizza with red sauce. ? Fried and fatty foods. These include donuts, french fries, potato chips, and high-fat dressings. ? High-fat meats. These include hot dogs, rib eye steak, sausage, ham, and bacon. ? High-fat dairy items, such as whole milk, butter, and cream cheese.  Eat small meals often. Avoid eating large meals.  Avoid drinking large amounts of liquid with your meals.  Avoid eating meals during the 2-3 hours before bedtime.  Avoid lying down right after you eat.  Do not exercise right after you eat.   Lifestyle  Do not smoke or use any products that contain nicotine or tobacco. If you need help quitting, ask your doctor.  Try to lower your stress. If you need help doing this, ask your doctor.  If you are overweight, lose an amount of weight that is healthy for you. Ask your doctor about a safe weight loss goal.   General instructions  Pay attention to any changes in your symptoms.  Take over-the-counter and prescription medicines only as told by your doctor.  Do not take aspirin, ibuprofen, or other NSAIDs unless your doctor says it is okay.  Wear loose clothes. Do not wear anything tight around your waist.  Raise (elevate) the head of your bed about 6 inches (15 cm). You may need to use a wedge to do this.  Avoid bending over if this makes your  symptoms worse.  Keep all follow-up visits. Contact a doctor if:  You have new symptoms.  You lose weight and you do not know why.  You have trouble swallowing or it hurts to swallow.  You have wheezing or a cough that keeps happening.  You have a hoarse voice.  Your symptoms do not get better with treatment. Get help right away if:  You have sudden pain in your arms, neck, jaw, teeth, or back.  You suddenly feel sweaty, dizzy, or light-headed.  You have chest pain or shortness of breath.  You vomit and the vomit is green, yellow, or black, or it looks like blood or coffee grounds.  You faint.  Your poop (stool) is red, bloody, or black.  You cannot swallow, drink, or eat. These symptoms may represent a serious problem that is an emergency. Do not wait to see if the symptoms will go away. Get medical help right away. Call your local emergency services (911 in the [place] Do not drive yourself to the hospital. Summary  If a person has gastroesophageal reflux disease (GERD), food and stomach acid move back up into the esophagus and cause symptoms or problems such as damage to the esophagus.  Treatment will depend on how bad your symptoms are.  Follow a diet as told by your doctor.  Take all medicines only as told by your doctor. This information is not intended to replace advice given to you by your health care provider. Make sure you discuss any questions you have with your health care provider. Document Revised: 07/12/2019 Document Reviewed: 07/12/2019 Elsevier Patient Education  [place].

## 2020-06-29 ENCOUNTER — Other Ambulatory Visit: Payer: Self-pay | Admitting: Pediatrics

## 2020-07-11 ENCOUNTER — Other Ambulatory Visit (INDEPENDENT_AMBULATORY_CARE_PROVIDER_SITE_OTHER): Payer: Self-pay | Admitting: Pediatrics

## 2020-07-11 ENCOUNTER — Telehealth (INDEPENDENT_AMBULATORY_CARE_PROVIDER_SITE_OTHER): Payer: Self-pay | Admitting: Pediatrics

## 2020-07-11 DIAGNOSIS — G40309 Generalized idiopathic epilepsy and epileptic syndromes, not intractable, without status epilepticus: Secondary | ICD-10-CM

## 2020-07-11 NOTE — Telephone Encounter (Signed)
  Who's calling (name and relationship to patient) :[name]   Best contact number:[phone]  Provider they see:Dr. Gaynell Face   Reason for call:pharmacy called requesting a 3 month supply with refills. Pharmacy stated that mom requested them to call so she doesn't have to keep coming back to get refills      PRESCRIPTION REFILL ONLY  Name of prescription:Keppra Liquid   Pharmacy:[facility]

## 2020-07-11 NOTE — Telephone Encounter (Signed)
Spoke to the pharmacy to inform them that there was only a 30-day refill sent in due to not seeing the patient in over a year. Pharmacist understood and will relay the message to mom

## 2020-08-04 ENCOUNTER — Other Ambulatory Visit (INDEPENDENT_AMBULATORY_CARE_PROVIDER_SITE_OTHER): Payer: Self-pay | Admitting: Pediatrics

## 2020-08-04 DIAGNOSIS — G40309 Generalized idiopathic epilepsy and epileptic syndromes, not intractable, without status epilepticus: Secondary | ICD-10-CM

## 2020-08-09 ENCOUNTER — Encounter (INDEPENDENT_AMBULATORY_CARE_PROVIDER_SITE_OTHER): Payer: Self-pay | Admitting: Pediatrics

## 2020-08-09 ENCOUNTER — Other Ambulatory Visit: Payer: Self-pay

## 2020-08-09 ENCOUNTER — Ambulatory Visit (INDEPENDENT_AMBULATORY_CARE_PROVIDER_SITE_OTHER): Payer: BC Managed Care – PPO | Admitting: Pediatrics

## 2020-08-09 VITALS — BP 110/80 | HR 88 | Wt 100.0 lb

## 2020-08-09 DIAGNOSIS — H479 Unspecified disorder of visual pathways: Secondary | ICD-10-CM

## 2020-08-09 DIAGNOSIS — F73 Profound intellectual disabilities: Secondary | ICD-10-CM

## 2020-08-09 DIAGNOSIS — G40309 Generalized idiopathic epilepsy and epileptic syndromes, not intractable, without status epilepticus: Secondary | ICD-10-CM

## 2020-08-09 DIAGNOSIS — Z931 Gastrostomy status: Secondary | ICD-10-CM

## 2020-08-09 DIAGNOSIS — M4145 Neuromuscular scoliosis, thoracolumbar region: Secondary | ICD-10-CM

## 2020-08-09 DIAGNOSIS — G8250 Quadriplegia, unspecified: Secondary | ICD-10-CM

## 2020-08-09 DIAGNOSIS — Q043 Other reduction deformities of brain: Secondary | ICD-10-CM

## 2020-08-09 DIAGNOSIS — Q040 Congenital malformations of corpus callosum: Secondary | ICD-10-CM

## 2020-08-09 DIAGNOSIS — Q939 Deletion from autosomes, unspecified: Secondary | ICD-10-CM

## 2020-08-09 MED ORDER — LEVETIRACETAM 100 MG/ML PO SOLN: ORAL | 5 refills | Status: DC

## 2020-08-09 MED ORDER — VALTOCO 10 MG DOSE 10 MG/0.1ML NA LIQD: NASAL | 5 refills | Status: DC

## 2020-08-09 NOTE — Patient Instructions (Signed)
At [facility], we are committed to providing exceptional care. You will receive a patient satisfaction survey through text or email regarding your visit today. Your opinion is important to me. Comments are appreciated.   It is a pleasure to see you today.  Your provider will be Dr. Franco Nones.  Up until September 30 if there is anything you need let me know.  After that time ask to leave a message for her.

## 2020-08-09 NOTE — Progress Notes (Addendum)
Patient: [name] MRN: [id number] Sex: male DOB: [birth year]  Provider: Wyline Copas, MD Location of Care: [facility] Child Neurology  Note type: Routine return visit  History of Present Illness: Referral Source: [name], MD History from: mother, patient, and [facility] chart Chief Complaint: Epilepsy  [name] is a 25 y.o. male who was evaluated August 09, 2020 for the first time since May 05, 2019.  [name] has a complex cerebral malformation with pachygyria, agenesis of the corpus callosum, hydrocephalus ex vacuo.  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  He has spontaneous and stimulus sensitive myoclonus.  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  He has experience generalized tonic-clonic seizures before and in mid-December, 2019 had a cluster of seizures that required an ED visit.  He was given IV levetiracetam and his dose was increased.  Hip subluxation and scoliosis have been treated surgically.  His mother estimates that he has seizures as often as once a week or as infrequently as every other week.  He goes to bed at 10 between 10 PM and 10:30 PM and will often sleep until 10 AM.  In May he contracted COVID and had fever, somnolence, and vomiting.  Other members of the family got sick.  No one is vaccinated.  He is tube fed with liquid Hope.  Review of Systems: A complete review of systems was remarkable for patient is here to be seen for a follow up. Mom reports that the patient will have  an occasional seizure. She reports that the seizures are not often and do not last long. She reports no concerns at this time, all other systems reviewed and negative.  Past Medical History Diagnosis Date   Cerebral palsy ([place])    G tube feedings ([place])    Gastrostomy feeding 12/25/2010   Hematuria, gross 12/25/2010   Seizures ([place])    Hospitalizations: No., Head Injury: No., Nervous System Infections: No., Immunizations up to date: Yes.    Copied from prior chart 14q12 deletion disorder that was discovered on chromosomal micro array at [facility].   MRI of the brain December 11, 2000 shows agenesis of the corpus callosum, hydrocephalus ex vacuo, and coarse gyral configuration suggesting pachygyria.    He has spastic quadriparesis, severe dysphagia requiring feeding by percutaneous gastrostomy, cortical visual impairment, and severe cognitive impairment.  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  He has had multiple operations on his hips and a Harrington rod procedure.   I saw him because of spontaneous and stimulus-sensitive myoclonus.  He has also had episodes of eye deviation, jaw thrusting, and myoclonic movements that suggested the possibility of seizures.   Routine EEG on November 22, 2011 showed diffuse background slowing without seizures.   He was unable to cooperate for sleep study in [place] and would not keep the leads on his head, face, or body. January, 2015   He had a polysomnogram at [facility] on May 15, 2013, that failed to show significant issues of arousal either with apnea or periodic limb movements.  There were no seizures noted.  Birth History 7 lbs. 9 oz. Infant born at [redacted] weeks gestational age to a g 2 p 1 0 0 1 male.   Gestation was uncomplicated   Mother received Epidural anesthesia normal spontaneous vaginal delivery   Nursery Course was uncomplicated   Growth and Development was recalled and recorded as globally delayed   Behavior  History none  Surgical History Procedure Laterality Date   CIRCUMCISION  1999   GASTROSTOMY TUBE PLACEMENT  2008   HIP SURGERY  2010   SPINAL FUSION  August 2014   Family History family history includes Diabetes in his paternal grandfather; Hyperlipidemia in his paternal grandmother; Hypertension in his paternal grandfather. Family history is negative for migraines, seizures, intellectual disabilities, blindness, deafness, birth defects, chromosomal disorder, or autism.  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 He lives with his parents and siblings.    Allergies Allergen Reactions   Cephalosporins Hives    Neck and face   Cefazolin Hives   Cephalexin Hives   Physical Exam BP 110/80   Pulse 88   Wt 100 lb (45.4 kg)   BMI 17.16 kg/m   General: alert, well developed, well nourished, in no acute distress, brown hair, brown eyes,  non- handed Head: microcephalic, no dysmorphic features Ears, Nose and Throat: Otoscopic: tympanic membranes not visualized due to wax Neck: supple, full range of motion, no cranial or cervical bruits Respiratory: auscultation clear Cardiovascular: no murmurs, pulses are normal Musculoskeletal: surgically treated scoliosis, spastic quadriparesis with joints reducible to neutral position with passive range of motion Skin: no rashes or neurocutaneous lesions  Neurologic Exam  Mental Status: awake but unaware of the examiner, moves his head back-and-forth, he is not able to vocalize other than grunts, does not follow  commands Cranial Nerves: severe cortical visual impairment, does not localize objects or fix and follow on them extraocular movements are full and conjugate; pupils are round, sluggishly reactive to light; funduscopic examination shows bilateral positive red reflex; symmetric, impassive facial strength; midline tongue; does not localize sound bilaterally Motor: spastic quadriparesis with minimal fine motor movements Sensory: intact responses to cold, vibration, proprioception and stereognosis Coordination: withdrawal x4 Gait and Station: wheelchair-bound Reflexes: symmetric and diminished bilaterally due to cocontraction; no clonus  Assessment Generalized nonconvulsive epilepsy, 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 Gastrostomy tube dependent, Z93.1. 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  Discussion [name] is physically and neurologically stable.  There is no reason to change his levetiracetam.  Higher doses are not likely to significantly decrease the infrequent seizures that he has.  Fortunately he has not experienced any episodes of repetitive seizures or status epilepticus.  Plan Prescription was refilled for levetiracetam.  I also wrote a new prescription for Valtoco which will likely require prior authorization.  He will follow-up in 3 to 4 months with Dr. Franco Nones.  Greater than 50% of 30-minute visit was spent in counseling and coordination of care concerning  his multiple neurologic conditions which are stable and in discussing transition of care.   Medication List    Accurate as of August 09, 2020  7:24 PM. If you have any questions, ask your nurse or doctor.     TAKE these medications    budesonide 0.5 MG/2ML nebulizer solution Commonly known as: PULMICORT Take 2 mLs (0.5 mg  total) by nebulization daily.   famotidine 40 MG/5ML suspension Commonly known as: PEPCID Take 2.5 mLs (20 mg total) by mouth daily.   levETIRAcetam 100 MG/ML solution Commonly known as: KEPPRA TAKE TEN MILLILITERS BY MOUTH TWO TIMES A DAY   levocetirizine 2.5 MG/5ML solution Commonly known as: XYZAL TAKE TEN MILLILITERS BY MOUTH EACH EVENING   Valtoco 10 MG Dose 10 MG/0.1ML Liqd Generic drug: diazePAM Give 10 mg nasally after 2 minutes of seizure Replaces: diazepam 10 MG Gel Started by: Wyline Copas, MD     The medication list was reviewed and reconciled. All changes or newly prescribed medications were explained.  A complete medication list was provided to the patient/caregiver.  Jodi Geralds MD

## 2020-08-11 ENCOUNTER — Telehealth (INDEPENDENT_AMBULATORY_CARE_PROVIDER_SITE_OTHER): Payer: Self-pay | Admitting: Pediatrics

## 2020-08-11 NOTE — Telephone Encounter (Signed)
Spoke with mom and let her know and let her know Dr. Gaynell Face is out of the office and will not return until Wednesday. Mom okay with this timeline.

## 2020-08-11 NOTE — Telephone Encounter (Signed)
  Who's calling (name and relationship to patient) : Dorantes,Michele (Mother) Best contact number: [phone] (Home) Provider they see: Jodi Geralds, MD Reason for call: Mom dropped off Physician's order left in Dr.Hickling box. Please contact mom when ready for pick up    [place]  Name of prescription:  Pharmacy:

## 2020-08-16 NOTE — Telephone Encounter (Signed)
Form was completed, signed, and given to Tiffanie for disposition.

## 2020-11-22 ENCOUNTER — Ambulatory Visit (INDEPENDENT_AMBULATORY_CARE_PROVIDER_SITE_OTHER): Payer: BC Managed Care – PPO | Admitting: Pediatrics

## 2020-12-21 ENCOUNTER — Ambulatory Visit (INDEPENDENT_AMBULATORY_CARE_PROVIDER_SITE_OTHER): Payer: BC Managed Care – PPO | Admitting: Pediatrics

## 2020-12-21 ENCOUNTER — Encounter (INDEPENDENT_AMBULATORY_CARE_PROVIDER_SITE_OTHER): Payer: Self-pay | Admitting: Pediatrics

## 2020-12-21 ENCOUNTER — Other Ambulatory Visit: Payer: Self-pay

## 2020-12-21 VITALS — BP 102/60 | HR 74 | Wt 100.0 lb

## 2020-12-21 DIAGNOSIS — F73 Profound intellectual disabilities: Secondary | ICD-10-CM

## 2020-12-21 DIAGNOSIS — G40219 Localization-related (focal) (partial) symptomatic epilepsy and epileptic syndromes with complex partial seizures, intractable, without status epilepticus: Secondary | ICD-10-CM | POA: Diagnosis not present

## 2020-12-21 DIAGNOSIS — M4145 Neuromuscular scoliosis, thoracolumbar region: Secondary | ICD-10-CM

## 2020-12-21 DIAGNOSIS — Z931 Gastrostomy status: Secondary | ICD-10-CM

## 2020-12-21 DIAGNOSIS — Q040 Congenital malformations of corpus callosum: Secondary | ICD-10-CM

## 2020-12-21 DIAGNOSIS — Q939 Deletion from autosomes, unspecified: Secondary | ICD-10-CM

## 2020-12-21 DIAGNOSIS — G40309 Generalized idiopathic epilepsy and epileptic syndromes, not intractable, without status epilepticus: Secondary | ICD-10-CM | POA: Diagnosis not present

## 2020-12-21 DIAGNOSIS — Q043 Other reduction deformities of brain: Secondary | ICD-10-CM

## 2020-12-21 DIAGNOSIS — H479 Unspecified disorder of visual pathways: Secondary | ICD-10-CM

## 2020-12-21 DIAGNOSIS — G8250 Quadriplegia, unspecified: Secondary | ICD-10-CM

## 2020-12-21 NOTE — Progress Notes (Signed)
Patient: [name] MRN: [id number] Sex: male DOB: [birth year]  Provider: Franco Nones, MD Location of Care: [facility]rology Note type: Return visit for follow up Last visit: 08/09/2020 History of Present Illness: Referral Source: [name], MD Date of Evaluation: 12/21/2020 Chief Complaint: Epilepsy follow up  [name] is a 25 y.o. male with history significant for epilepsy, complex cerebral malformation with pachygyria, agenesis of the corpus callosum, hydrocephalus ex vacuo, spastic quadriparesis, severe dysphagia status post feeding gastrostomy, cortical visual impairments, severe intellectual disability, bilateral subluxation of hips, and neuromuscular scoliosis.  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  He was seen by Dr. Gaynell Face in August 09, 2020.  Per Dr. Gaynell Face notes, his epilepsy appears to be more focal epilepsy with impairments of consciousness.  He describes his seizures as head turned either side, facial tightness and eye eyes deviation that lasted for 20 seconds.  He continued to take Keppra 1000 mg twice a day. He sleeps throughout the night no problems.  Mother has no concerns today.  His last seizure occurred yesterday as typical for focal seizure that lasted short for 20 seconds. Sometimes has trouble to sleep. Sometimes going to sleep, he takes naps during the day. G tube. 4 feeding a day.  Epilepsy/seizure History: (summarize)  Age at seizure onset: 25 years old.  Description of all seizure types and duration: Patient has one type seizure semiology described as head turn either sides, facial tightness, eyes deviation, raised upper extremities, associated with throat sounds. The episodes last shortly ~20 seconds. His legs sometimes twitches at the end or he would laugh.  History of generalized tonic-clonic seizures 2019 for which she had clusters of seizure required  emergency department visit.  Complications from seizures (trauma, etc.): None  h/o status epilepticus: last year   Date of most recent seizure: 12/20/2020 Seizure frequency past month (exact number or average per day): 1-2, mother thinks that weather change and sleep deprivation trigger his seizures.  Past 3 months: 1 Past year:1  Current AEDs: Keppra 1000 mg twice a day.   Current side effects: None  Prior AEDs (d/c reason?): He is on keppra for many years.   Adherence Estimate: Excellent   Previous history : Copied from Dr. Gaynell Face note  14q12 deletion disorder that was discovered on chromosomal micro array at [facility].   MRI of the brain December 11, 2000 shows agenesis of the corpus callosum, hydrocephalus ex vacuo, and coarse gyral configuration suggesting pachygyria.   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  He has had multiple operations on his hips and a Harrington rod procedure.  History of spontaneous and stimulus-sensitive myoclonus.  He has also had episodes of eye deviation, jaw thrusting, and myoclonic movements that suggested the possibility of seizures.   Routine EEG on November 22, 2011 showed diffuse background slowing without seizures.   He was unable to cooperate for sleep study in [place] and would not keep the leads on his head, face, or body. January, 2015   He had a polysomnogram at [facility] on May 15, 2013, that failed to show significant issues of arousal either with apnea or periodic limb movements.  There were no seizures noted.   Past Medical History: Spastic quadriplegia cerebral palsy Severe dysphagia requiring G-tube feeding Abnormal MRI with pachygyria, agenesis of corpus callosum and hydrocephalus ex vacuo Chromosomal deletion disorder at 4 q. 12 Cortical visual impairments Bilateral subluxation of hips Neuromuscular scoliosis  Epilepsy focal epilepsy with impairments of consciousness. Spontaneous stimulus sensitive  myoclonus Severe intellectual disability  Past Surgical History: Circumcision Gastrostomy tube placement Hip surgery spinal fusion  Allergies  Allergen Reactions   Cephalosporins Hives    Neck and face   Cefazolin Hives   Cephalexin Hives    Medications: Current Outpatient Medications on File Prior to Visit  Medication Sig Dispense Refill   budesonide (PULMICORT) 0.5 MG/2ML nebulizer solution Take 2 mLs (0.5 mg total) by nebulization daily. 60 mL 12   famotidine (PEPCID) 40 MG/5ML suspension Take 2.5 mLs (20 mg total) by mouth daily. 50 mL 0   levETIRAcetam (KEPPRA) 100 MG/ML solution TAKE TEN MILLILITERS BY MOUTH TWO TIMES A DAY 640 mL 5   levocetirizine (XYZAL) 2.5 MG/5ML solution TAKE TEN MILLILITERS BY MOUTH EACH EVENING 148 mL 11   VALTOCO 10 MG DOSE 10 MG/0.1ML LIQD Give 10 mg nasally after 2 minutes of seizure 1 each 5   No current facility-administered medications on file prior to visit.   Birth History he was born full-term at [redacted] weeks gestation to G2 P1-0-0-1 mother, via normal spontaneous vaginal delivery with no perinatal events.  his birth weight was 7 lbs.  9 oz.  He did not require a NICU stay.   Developmental history: History of developmental delay, and severe intellectual disability.  Schooling: he attends graduated form [employer]. Day program Monday.    Social and family history: he lives with parents and siblings.  Both parents are in apparent good health. Siblings are also healthy. There is no family history of speech delay, learning difficulties in school, intellectual disability, epilepsy or neuromuscular disorders.   Family History family history includes Diabetes in his paternal grandfather; Hyperlipidemia in his paternal grandmother; Hypertension in his paternal grandfather.   Review of Systems Constitutional: Negative for fever, malaise/fatigue and weight loss.  HENT: Negative for congestion, ear pain, hearing loss, sinus pain and  sore throat.   Eyes: Negative for blurred vision, double vision, photophobia, discharge and redness.  Respiratory: Negative for cough, shortness of breath and wheezing.   Cardiovascular: Negative for chest pain, palpitations and leg swelling.  Gastrointestinal: Negative for abdominal pain, blood in stool, constipation, nausea and vomiting.  Genitourinary: Negative for dysuria and frequency.  Musculoskeletal: Negative for back pain, falls, joint pain and neck pain.  Skin: Negative for rash.  Neurological: Negative for dizziness, tremors, focal weakness, seizures, weakness and headaches.  Psychiatric/Behavioral: Negative for memory loss. The patient is not nervous/anxious and does not have insomnia.   EXAMINATION Physical examination: Today's Vitals   12/21/20 1421  BP: 102/60  Pulse: 74  Weight: 100 lb (45.4 kg)   Body mass index is 17.16 kg/m.  General examination: he is alert and active in no apparent distress. There are no dysmorphic features. Chest examination reveals normal breath sounds, and normal heart sounds with no cardiac murmur.  Abdominal examination does not show any evidence of hepatic or splenic enlargement, or any abdominal masses or bruits.   Neurologic examination: he is awake, alert, non verbal.  Cranial nerves: Pupils are equal, symmetric, circular and reactive to light.  He is not tracking object visually. There is no ptosis or nystagmus.  There is no facial asymmetry, with normal facial movements bilaterally. The tongue is midline.excessive salivation.  Motor assessment:spastic quadriparas throughout with joint contractures. Moves his upper extremities against gravity LT>RT./ Deep tendon reflexes are 3+ and symmetric at the biceps, knees and ankles.  Plantar response is equivocal.  Sensory examination:  react to tactile stimulation.  Co-ordination and gait:  There is no evidence of tremor, dystonic posturing or any abnormal movements.   Wheelchair dependent.    CBC    Component Value Date/Time   WBC 5.2 06/05/2020 1725   RBC 4.82 06/05/2020 1725   HGB 14.2 06/05/2020 1725   HCT 42.6 06/05/2020 1725   PLT 128 (L) 06/05/2020 1725   MCV 88.4 06/05/2020 1725   MCH 29.5 06/05/2020 1725   MCHC 33.3 06/05/2020 1725   RDW 12.6 06/05/2020 1725   LYMPHSABS 1.5 12/27/2017 2312   MONOABS 0.6 12/27/2017 2312   EOSABS 0.2 12/27/2017 2312   BASOSABS 0.1 12/27/2017 2312    CMP     Component Value Date/Time   NA 146 (H) 06/05/2020 1725   K 4.7 06/05/2020 1725   CL 104 06/05/2020 1725   CO2 33 (H) 06/05/2020 1725   GLUCOSE 93 06/05/2020 1725   BUN 13 06/05/2020 1725   CREATININE 0.36 (L) 06/05/2020 1725   CREATININE 0.63 11/05/2010 1821   CALCIUM 9.0 06/05/2020 1725   PROT 7.6 06/05/2020 1725   ALBUMIN 4.1 06/05/2020 1725   AST 67 (H) 06/05/2020 1725   ALT 60 (H) 06/05/2020 1725   ALKPHOS 50 06/05/2020 1725   BILITOT 0.4 06/05/2020 1725   GFRNONAA >60 06/05/2020 1725   GFRAA >60 12/27/2017 2312    Assessment and Plan [name] is a 25 y.o. male with history of epilepsy, complex cerebral malformation with pachygyria, agenesis of the corpus callosum, hydrocephalus ex vacuo, spastic quadriparesis, severe dysphagia status post feeding gastrostomy, cortical visual impairments, severe intellectual disability, bilateral subluxation of hips, and neuromuscular scoliosis.  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  He was seen by Dr. Gaynell Face in August 09, 2020. He is taking keppra 1000 mg BID for many years. His seizures occur 1-2 per year, and last focal seizure reported yesterday. His neurological examination is stable. I have discussed with mother to increase keppra dose giving breakthrough seizures but she decided to stay on Keppra 1000 mg BID.    PLAN: Continue Keppra 10 ml twice a day Referral to our dietitian to go over his current G-tube feeding.  Follow-up in June 2023 Call neurology for  any questions or concerns  Counseling/Education: seizure safety.   Total time spent with the patient was 40 minutes, of which 50% or more was spent in counseling and coordination of care.   The plan of care was discussed, with acknowledgement of understanding expressed by his mother.   Franco Nones Neurology and epilepsy attending [facility] Child [facility]rology Ph. [phone] Fax [phone]

## 2020-12-21 NOTE — Patient Instructions (Signed)
I had the pleasure of seeing [name] today for neurology for epilepsy follow up. Jasaun was accompanied by his mother who provided historical information.    Plan: Continue Keppra 10 ml twice a day Referral to our dietitian Follow-up in June 2023 Call neurology for any questions or concerns

## 2020-12-22 MED ORDER — LEVETIRACETAM 100 MG/ML PO SOLN: 1000.0000 mg | Freq: Two times a day (BID) | ORAL | 6 refills | Status: DC

## 2021-01-17 NOTE — Progress Notes (Signed)
Medical Nutrition Therapy - Initial Assessment Appt start time: 1:32 PM Appt end time: 2:30 PM  Reason for referral: Gtube dependence Referring provider: Dr. Coralie Keens - Neuro Pertinent medical hx: Spastic Quadriparesis, Intellectual Disability, Laryngopharyngeal reflux, Epilepsy, Cerebral Palsy, Chromosomal deletion syndrome, dysphagia, +Gtube  Assessment: Food allergies: none noted in Epic  Pertinent Medications: see medication list - Keppra, Prevacid Vitamins/Supplements: vitamin C (unsure of amount), zinc (unsure of amount), vitamin D3 (unsure of amount) Pertinent labs: labs related to hospitalization and likely not indicative of nutritional status.  (1/12) Anthropometrics: Ht: 162.5 cm   Wt: 45.4 kg   BMI: 17.1    12/21/20 Wt: 45.4 kg; BMI: 17.1 08/09/20 Wt: 45.4 kg; BMI: 17.1  06/15/20 Wt: 41.4 kg; BMI: 15.5  04/29/19 Wt: 45.4 kg; BMI: 17.1  Estimated minimum caloric needs: 1575 kcal/day (wt maintenance with current regimen) Estimated minimum protein needs: 0.8 g/day (DRI) Estimated minimum fluid needs: 1350-1600 mL/day (25-35 mL/kg)  Primary concerns today: Consult for gtube dependence. Mom accompanied pt to appt today.   Dietary Intake Hx: Formula: Liquid Hope  Current regimen:  Day feeds: 6 oz (180 mL) @ 540 mL/hr x 1 feed (7:30 AM); 12 oz (360 mL) @ 540 mL/hr x 3 feeds (11:30 AM, 3:30 PM, 7:30 PM)  Overnight feeds: none Total Volume: 3.5 pouches total   FWF: 50 mL after each feed (x4); 150 mL (1:30, 5:30); 20 mL (x3) - 560 mL   Position during feeds: upright in chair (stays upright for at least a few hours after feed to aid in reflux) PO foods/beverages: tastes occasionally  GI: daily - Miralax (unable to go without Miralax), laxative PRN GU: 4-5+/day   Physical Activity: wheelchair bound  Estimated caloric intake: 1575 kcal/day - meets 100% of estimated needs Estimated protein intake: 1.8 g/kg/day - meets 225% of estimated needs Estimated fluid intake: 1410  mL/day - meets 94% of estimated needs  Micronutrient intake:  Vitamin A 1204 mcg  Vitamin C 402.5 mg  Vitamin D 13.1 mcg  Vitamin E 30.5 mg  Vitamin K 165.2 mcg  Vitamin B1 (thiamin) 1.4 mg  Vitamin B2 (riboflavin) 1.5 mg  Vitamin B3 (niacin) 18.8 mg  Vitamin B5 (pantothenic acid) 4.4 mg  Vitamin B6 2.1 mg  Vitamin B7 (biotin) 232.3 mcg  Vitamin B9 (folate) 416.5 mcg  Vitamin B12 5.3 mcg  Choline 427 mg  Calcium 875 mg  Chromium 79.8 mcg  Copper 1050 mcg  Fluoride 0 mg  Iodine 185.5 mcg  Iron 28.7 mg  Magnesium 420 mg  Manganese 4.7 mg  Molybdenum 126 mcg  Phosphorous 767.2 mg  Selenium 59.5 mcg  Zinc 12.6 mg  Potassium 3360 mg  Sodium 1330 mg  Chloride 2744 mg  Fiber 24.5 g    Nutrition Diagnosis: (1/12)  Inadequate oral intake related to medical condition as evidenced by pt dependent on Gtube feedings to meet nutritional needs.  Intervention: Discussed pt's growth and current regimen. Discussed recommendations below. All questions answered, family in agreement with plan.   Nutrition Recommendations: - Continue current regimen, except change free water flush to recommendations below.  - Add 100 mL to morning feed @ 7:30 AM.  - 50 mL with each medication (x3 medications)  - 150 mL flush at 1:30 PM  - Continue vitamin D supplementation.   Teach back method used.  Monitoring/Evaluation: Continue to Monitor: - Weight trends  - TF tolerance   Follow-up in 5 months, joint with Dr. Coralie Keens.  Total time spent in counseling: 49  minutes.

## 2021-01-25 ENCOUNTER — Ambulatory Visit (INDEPENDENT_AMBULATORY_CARE_PROVIDER_SITE_OTHER): Payer: BC Managed Care – PPO | Admitting: Dietician

## 2021-01-25 ENCOUNTER — Other Ambulatory Visit: Payer: Self-pay

## 2021-01-25 DIAGNOSIS — Z931 Gastrostomy status: Secondary | ICD-10-CM | POA: Diagnosis not present

## 2021-01-25 NOTE — Patient Instructions (Addendum)
Nutrition Recommendations: - Continue current regimen, except change free water flush to recommendations below.  - Add 100 mL to morning feed @ 7:30 AM.  - Continue 50 mL flush after each feed (x4 feeds) - 50 mL with each medication (x3 medications)  - 150 mL flush at 1:30 PM  - Continue vitamin D supplementation.

## 2021-05-31 ENCOUNTER — Ambulatory Visit (INDEPENDENT_AMBULATORY_CARE_PROVIDER_SITE_OTHER): Payer: BC Managed Care – PPO | Admitting: Pediatrics

## 2021-05-31 VITALS — BP 100/60 | Ht 64.0 in | Wt 100.0 lb

## 2021-05-31 DIAGNOSIS — Z0000 Encounter for general adult medical examination without abnormal findings: Secondary | ICD-10-CM

## 2021-05-31 DIAGNOSIS — Z0001 Encounter for general adult medical examination with abnormal findings: Secondary | ICD-10-CM | POA: Diagnosis not present

## 2021-05-31 DIAGNOSIS — G40309 Generalized idiopathic epilepsy and epileptic syndromes, not intractable, without status epilepticus: Secondary | ICD-10-CM

## 2021-05-31 DIAGNOSIS — G808 Other cerebral palsy: Secondary | ICD-10-CM

## 2021-05-31 DIAGNOSIS — F88 Other disorders of psychological development: Secondary | ICD-10-CM

## 2021-05-31 DIAGNOSIS — Q939 Deletion from autosomes, unspecified: Secondary | ICD-10-CM | POA: Diagnosis not present

## 2021-05-31 DIAGNOSIS — G8250 Quadriplegia, unspecified: Secondary | ICD-10-CM

## 2021-05-31 DIAGNOSIS — Z931 Gastrostomy status: Secondary | ICD-10-CM

## 2021-05-31 DIAGNOSIS — L600 Ingrowing nail: Secondary | ICD-10-CM

## 2021-06-02 ENCOUNTER — Encounter: Payer: Self-pay | Admitting: Pediatrics

## 2021-06-02 DIAGNOSIS — L600 Ingrowing nail: Secondary | ICD-10-CM | POA: Insufficient documentation

## 2021-06-02 NOTE — Patient Instructions (Signed)
Seizure, Adult A seizure is a sudden burst of abnormal electrical and chemical activity in the brain. Seizures usually last from 30 seconds to 2 minutes. The abnormal activity temporarily interrupts normal brain function. Many types of seizures can affect adults. A seizure can cause many different symptoms depending on where in the brain it starts. What are the causes? Common causes of this condition include: Fever or infection. Brain injury, head trauma, bleeding in the brain, or a brain tumor. Low levels of blood sugar or salt (sodium). Kidney problems or liver problems. Metabolic disorders or other conditions that are passed from parent to child (are inherited). Reaction to a substance, such as a drug or a medicine, or suddenly stopping the use of a substance (withdrawal). A stroke. Developmental disorders such as autism spectrum disorder or cerebral palsy. In some cases, the cause of a seizure may not be known. Some people who have a seizure never have another one. A person who has repeated seizures over time without a clear cause has a condition called epilepsy. What increases the risk? You are more likely to develop this condition if: You have a family history of epilepsy. You have had a tonic-clonic seizure before. This type of seizure causes tightening (contraction) of the muscles of the whole body and loss of consciousness. You have a history of head trauma, lack of oxygen at birth, or strokes. What are the signs or symptoms? There are many different types of seizures. The symptoms vary depending on the type of seizure you have. Symptoms occur during the seizure. They may also occur before a seizure (aura) and after a seizure (postictal). Symptoms may include the following: Symptoms during a seizure Uncontrollable shaking (convulsions) with fast, jerky movements of muscles. Stiffening of the body. Breathing problems. Confusion, staring, or unresponsiveness. Head nodding, eye  blinking or fluttering, or rapid eye movements. Drooling, grunting, or making clicking sounds with your mouth. Loss of bladder control and bowel control. Symptoms before a seizure Fear or anxiety. Nausea. Vertigo. This is a feeling like: You are moving when you are not. Your surroundings are moving when they are not. Dj vu. This is a feeling of having seen or heard something before. Odd tastes or smells. Changes in vision, such as seeing flashing lights or spots. Symptoms after a seizure Confusion. Sleepiness. Headache. Sore muscles. How is this diagnosed? This condition may be diagnosed based on: A description of your symptoms. Video of your seizures can be helpful. Your medical history. A physical exam. You may also have tests, including: Blood tests. CT scan. MRI. Electroencephalogram (EEG). This test measures electrical activity in the brain. An EEG can predict whether seizures will return. A spinal tap, also called a lumbar puncture. This is the removal and testing of fluid that surrounds the brain and spinal cord. How is this treated? Most seizures will stop on their own in less than 5 minutes, and no treatment is needed. Seizures that last longer than 5 minutes will usually need treatment. Seizures may be treated with: Medicines given through an IV. Avoiding known triggers, such as medicines that you take for another condition. Medicines to control seizures or prevent future seizures (antiepileptics), if epilepsy caused your seizures. Medical devices to prevent and control seizures. Surgery to stop seizures or to reduce how often seizures happen, if you have epilepsy that does not respond to medicines. A diet low in carbohydrates and high in fat (ketogenic diet). Follow these instructions at home: Medicines Take over-the-counter and prescription medicines only   as told by your health care provider. Avoid any substances that may prevent your medicine from working  properly, such as alcohol. Activity Follow instructions about activities, such as driving or swimming, that would be dangerous if you had another seizure. Wait until your health care provider says it is safe to do them. If you live in the [place] check with your local department of motor vehicles (DMV) to find out about local driving laws. Each state has specific rules about when you can legally drive again. Get enough rest. Lack of sleep can make seizures more likely to occur. Educating others  Teach friends and family what to do if you have a seizure. They should: Help you get down to the ground, to prevent a fall. Cushion your head and move items away from your body. Loosen any tight clothing around your neck. Turn you on your side. If you vomit, this helps keep your airway clear. Know whether or not you need emergency care. Stay with you until you recover. Also, tell them what not to do if you have a seizure. Tell them: They should not hold you down. Holding you down will not stop the seizure. They should not put anything in your mouth. General instructions Avoid anything that has ever triggered a seizure for you. Keep a seizure diary. Record what you remember about each seizure, especially anything that might have triggered it. Keep all follow-up visits. This is important. Contact a health care provider if: You have another seizure or seizures. Call each time you have a seizure. Your seizure pattern changes. You continue to have seizures with treatment. You have symptoms of an infection or illness. Either of these might increase your risk of having a seizure. You are unable to take your medicine. Get help right away if: You have: A seizure that does not stop after 5 minutes. Several seizures in a row without a complete recovery between seizures. A seizure that makes it harder to breathe. A seizure that leaves you unable to speak or use a part of your body. You do not wake up right  away after a seizure. You injure yourself during a seizure. You have confusion or pain right after a seizure. These symptoms may represent a serious problem that is an emergency. Do not wait to see if the symptoms will go away. Get medical help right away. Call your local emergency services (911 in the [place] Do not drive yourself to the hospital. Summary Seizures are caused by abnormal electrical and chemical activity in the brain. The activity disrupts normal brain function and can cause various symptoms. Seizures have many causes, including illness, head injuries, low levels of blood sugar or salt, and certain conditions. Most seizures will stop on their own in less than 5 minutes. Seizures that last longer than 5 minutes are a medical emergency and need treatment right away. Many medicines are used to treat seizures. Take over-the-counter and prescription medicines only as told by your health care provider. This information is not intended to replace advice given to you by your health care provider. Make sure you discuss any questions you have with your health care provider. Document Revised: 07/09/2019 Document Reviewed: 07/09/2019 Elsevier Patient Education  2023 Elsevier Inc.  

## 2021-06-02 NOTE — Progress Notes (Signed)
   History of Present Illness This is a 26 yo male with developmental delay and seizures who presents for this annual check  Main concerns today are: WINCARE--is getting 2 G tube extensions per month but mom requires 4 --note to be sent please provide 4 G tube extensions as needed instead of 2 since they become clogged and needs to be changed more frequently than every two weeks. [place] chair ---send order to [employer] Needs his [place] chair replaced since present one is falling apart.--send order to [employer]. Needs referral to podiatry for ingrown toe nails Will send message to Dr Coralie Keens to consider an increase of his Keppra since he is having breakthrough seizures more often. GI--appointment made with [facility] Needs a referral to [facility] for dental care -routine Needs Tdap but mom wanted to wait since he is at no risk for injury and she wanted to wait until it becomes necessary.  Dental cleaning --[facility] --needs sedation for cleaning --had appointment in 2022 but cancelled due to COVID   Developmental History Developmental delay Seizures Spastic quadriplegia   Function: Mobility: manual wheelchair Pain concerns: no Hand function: Right: reduced dexterity Left: reduced dexterity Spine curvature: mild Swallowing: normal and modified diet (pureed) Toileting: dependent   Equipment:  Wheelchair AFO's Hand splints Patent examiner out chair Suction tubes and suction machine Nebulizer Pulse ox Oxygen tubes and tank Diapers Wipes and gloves G -tube button with Tubings/stand  DIET--Liquid Hope 1 pouch 3.5 times a day--525ms  Oral feeds--Pureed--NECTAR Not much taken by mouth   Specialists- Neuro-DR [facility] --n/a Ortho-n/a Pulmonary-N/A Cardio--N/A Endocrinology--N/A Dental--Needs one Ophthal--seen  Urology--N/A Surgeon--N/A   Review of Systems: stable  The following portions of the  patient's history were reviewed and updated as appropriate: allergies, current medications, past family history, past medical history, past social history, past surgical history and problem list.  Referrals needed--  Dentist for special needs   Objective:    Physical Exam  Cognition: non-interactive Respiratory: normal, no increased effort Lower extremity function:AFO's Abdomen: normal-- G tube present Spine scoliosis: mild --post surgery Sitting Ability: assisted Gait: wheelchair    Assessment:    Annual physical exam   Plan:    1. Gross motor: delayed 2. Fine motor/ADL: delayed 3. Educational/vocational:n/a 4. Transition skills: n/a 5. Speech/swallowing: no speech, GERD 6. Orthopedics/bracing: bilateral AFO's --present today 7. Other equipment:  bath chair, gait trainer, hand/wrist splint(s), life system, stander, walker, wheelchair and RAMP for house.  Mom to call if she changes her mind about  Tdap vaccine

## 2021-06-21 ENCOUNTER — Ambulatory Visit (INDEPENDENT_AMBULATORY_CARE_PROVIDER_SITE_OTHER): Payer: BC Managed Care – PPO | Admitting: Podiatry

## 2021-06-21 ENCOUNTER — Encounter: Payer: Self-pay | Admitting: Podiatry

## 2021-06-21 ENCOUNTER — Ambulatory Visit (INDEPENDENT_AMBULATORY_CARE_PROVIDER_SITE_OTHER): Payer: BC Managed Care – PPO | Admitting: Pediatrics

## 2021-06-21 DIAGNOSIS — L600 Ingrowing nail: Secondary | ICD-10-CM

## 2021-06-21 NOTE — Progress Notes (Signed)
Subjective:   Patient ID: [name], male   DOB: 26 y.o.   MRN: [id number]   HPI Patient presents with caregiver with redness around the big toe with patient in a wheelchair and possibility he may have traumatized this and they are not sure.  Patient does have cerebral palsy and numerous issues but does have good care   Review of Systems  All other systems reviewed and are negative.       Objective:  Physical Exam Vitals and nursing note reviewed.  Constitutional:      Appearance: He is well-developed.  Pulmonary:     Effort: Pulmonary effort is normal.  Musculoskeletal:        General: Normal range of motion.  Skin:    General: Skin is warm.  Neurological:     Mental Status: He is alert.     Vascular status is intact with patient having no range of motion and in a fixed equinus condition with a significant frontal plane rotation of the hallux bilateral with redness around the right hallux where it may have been traumatized but it appears to be dry no proximal edema erythema drainage noted     Assessment:  Problem early for trauma of the right hallux secondary to the positional component but I do not see anything at this point to be concerned about     Plan:  Discussed with caregiver condition recommended soaks and keeping an eye on this but I do not think it will be long-term as far as pathology and I encouraged them to call with any questions concerns which may arise but it should be something that just gets better on its own and I do not think at this point he will lose the nail

## 2021-06-28 ENCOUNTER — Ambulatory Visit (INDEPENDENT_AMBULATORY_CARE_PROVIDER_SITE_OTHER): Payer: BC Managed Care – PPO | Admitting: Dietician

## 2021-06-28 ENCOUNTER — Ambulatory Visit (INDEPENDENT_AMBULATORY_CARE_PROVIDER_SITE_OTHER): Payer: BC Managed Care – PPO | Admitting: Pediatrics

## 2021-06-28 ENCOUNTER — Telehealth: Payer: Self-pay | Admitting: Pediatrics

## 2021-06-28 NOTE — Telephone Encounter (Signed)
Mother dropped off forms to be completed. Placed in Dr. Enid Derry office in basket.  Mother requests to be called once forms have been completed.

## 2021-07-02 NOTE — Telephone Encounter (Signed)
Child medical report filled  

## 2021-07-03 ENCOUNTER — Other Ambulatory Visit (INDEPENDENT_AMBULATORY_CARE_PROVIDER_SITE_OTHER): Payer: Self-pay | Admitting: Pediatrics

## 2021-07-03 DIAGNOSIS — G40309 Generalized idiopathic epilepsy and epileptic syndromes, not intractable, without status epilepticus: Secondary | ICD-10-CM

## 2021-07-03 MED ORDER — LEVETIRACETAM 100 MG/ML PO SOLN: 1000.0000 mg | Freq: Two times a day (BID) | ORAL | 6 refills | Status: DC

## 2021-07-03 NOTE — Telephone Encounter (Signed)
Who's calling (name and relationship to patient) : [name] mom   Best contact number: [phone]  Provider they see: Dr. Coralie Keens  Reason for call: Keppra Rx expired. Mom would like a new one. She also stated that Dr. Loni Muse wanted to increase it. Mom now wants the keppra increased.   Call ID:      PRESCRIPTION REFILL ONLY  Name of prescription:  Pharmacy:

## 2021-07-04 NOTE — Telephone Encounter (Signed)
Refill sent to pharmacy.   

## 2021-07-05 ENCOUNTER — Ambulatory Visit (INDEPENDENT_AMBULATORY_CARE_PROVIDER_SITE_OTHER): Payer: BC Managed Care – PPO | Admitting: Pediatrics

## 2021-07-10 ENCOUNTER — Other Ambulatory Visit: Payer: Self-pay | Admitting: Pediatrics

## 2021-07-10 ENCOUNTER — Telehealth: Payer: Self-pay | Admitting: Pediatrics

## 2021-07-10 MED ORDER — LANSOPRAZOLE 30 MG PO TBDD: DELAYED_RELEASE_TABLET | ORAL | 12 refills | Status: DC

## 2021-07-12 ENCOUNTER — Ambulatory Visit (INDEPENDENT_AMBULATORY_CARE_PROVIDER_SITE_OTHER): Payer: BC Managed Care – PPO | Admitting: Dietician

## 2021-07-12 ENCOUNTER — Encounter (INDEPENDENT_AMBULATORY_CARE_PROVIDER_SITE_OTHER): Payer: Self-pay | Admitting: Pediatrics

## 2021-07-12 ENCOUNTER — Ambulatory Visit (INDEPENDENT_AMBULATORY_CARE_PROVIDER_SITE_OTHER): Payer: BC Managed Care – PPO | Admitting: Pediatrics

## 2021-07-12 VITALS — BP 92/66 | HR 76 | Wt 100.0 lb

## 2021-07-12 DIAGNOSIS — Z931 Gastrostomy status: Secondary | ICD-10-CM

## 2021-07-12 DIAGNOSIS — G40219 Localization-related (focal) (partial) symptomatic epilepsy and epileptic syndromes with complex partial seizures, intractable, without status epilepticus: Secondary | ICD-10-CM

## 2021-07-12 DIAGNOSIS — G8250 Quadriplegia, unspecified: Secondary | ICD-10-CM | POA: Diagnosis not present

## 2021-07-12 DIAGNOSIS — Q939 Deletion from autosomes, unspecified: Secondary | ICD-10-CM

## 2021-07-12 DIAGNOSIS — F73 Profound intellectual disabilities: Secondary | ICD-10-CM

## 2021-07-12 DIAGNOSIS — G40309 Generalized idiopathic epilepsy and epileptic syndromes, not intractable, without status epilepticus: Secondary | ICD-10-CM

## 2021-07-12 DIAGNOSIS — Q040 Congenital malformations of corpus callosum: Secondary | ICD-10-CM

## 2021-07-12 MED ORDER — LEVETIRACETAM 100 MG/ML PO SOLN: 1200.0000 mg | Freq: Two times a day (BID) | ORAL | 6 refills | Status: DC

## 2021-07-12 NOTE — Progress Notes (Signed)
Patient: [name] MRN: [id number] Sex: Rivers DOB: 06/22/1995  Provider: Franco Nones, MD Location of Care: Pediatric Specialist- Pediatric Neurology Note type: Return visit for follow up Referral Source: [name], MD Date of Evaluation: 07/12/2021 Chief Complaint: Epilepsy follow up  Interim history: [name] is a 26 y.o. Rivers with history significant for epilepsy, complex cerebral malformation with pachygyria, agenesis of the corpus callosum, hydrocephalus ex vacuo, spastic quadriparesis, severe dysphagia status post feeding gastrostomy, cortical visual impairments, severe intellectual disability, bilateral subluxation of hips, and neuromuscular scoliosis.  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   [name] for follow up in December 2022. He has been taking and tolerating keppra 1000 mg BID with no side effects. Mother reported few breakthrough seizures happened during spring time. He had 2 seizures when his eyes gazed and deviated to the left side, both arms stiffened, lasted about a minute in duration. His oxygen dropped to 55 and required oxygen and had post ictal sleep. He had also brief seizures only with gaze deviation to the left for few seconds but would snap out of it quickly. Last seizure occurred in June 2023. Mother states that his seizures triggered by sleep deprivation and weather change. He goes to day program 3 days a week. Sometimes sleep at night and times stays up but would feel tired during the day.   He has constipation occasionally. Mother scheduled an appointment with GI specialist to evaluate and treat constipation and anal tear from constipation and also has been taking reflux medication for years. He was seen by our nutrition [name] who recommended to increase water flush and vitamin D supplements.   Epilepsy/seizure History: (summarize)  Age at seizure onset: 26 years old.   Description of all seizure types and duration: Patient has one type seizure semiology described as head turn either sides, facial tightness, eyes deviation, raised upper extremities, associated with throat sounds. The episodes last shortly ~20 seconds. His legs sometimes twitches at the end or he would laugh.  History of generalized tonic-clonic seizures 2019 for which she had clusters of seizure required emergency department visit.  Complications from seizures (trauma, etc.): None  h/o status epilepticus: last year   Date of most recent seizure: June 2023 Seizure frequency past month (exact number or average per day): 1, mother thinks that weather change and sleep deprivation trigger his seizures.  Past 3 months: 1-2  Current AEDs: Keppra 1000 mg twice a day.   Current side effects: None  Prior AEDs (d/c reason?): He is on keppra for many years.   Adherence Estimate: Excellent   Previous history : Copied from Dr. 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   MRI of the brain December 11, 2000 shows agenesis of the corpus callosum, hydrocephalus ex vacuo, and coarse gyral configuration suggesting pachygyria.   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  He has had multiple operations on his hips and a Harrington rod procedure.  History of spontaneous and stimulus-sensitive myoclonus.  He has also had episodes of eye deviation, jaw thrusting, and myoclonic movements that suggested the possibility of seizures.   Routine EEG on November 22, 2011 showed diffuse background slowing without seizures.   He was unable to cooperate for sleep study in [place] and would not keep the leads on his head, face, or body. January, 2015   He had a polysomnogram at  [facility] on May 15, 2013, that failed to show significant issues of arousal either with apnea or periodic limb movements.  There were no seizures noted.   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 Spontaneous stimulus sensitive myoclonus Severe intellectual disability  Past Surgical History: Circumcision Gastrostomy tube placement Hip surgery spinal fusion  Allergies  Allergen Reactions   Cephalosporins Hives    Neck and face   Cefazolin Hives   Cephalexin Hives    Medications: Current Outpatient Medications on File Prior to Visit  Medication Sig Dispense Refill   lansoprazole (PREVACID SOLUTAB) 30 MG disintegrating tablet give FIVE ml's via G-tube ONCE a DAY. shake WELL AND refrigerate. 150 tablet 12   levETIRAcetam (KEPPRA) 100 MG/ML solution Place 10 mLs (1,000 mg total) into feeding tube 2 (two) times daily. TAKE TEN MILLILITERS BY MOUTH TWO TIMES A DAY 640 mL 6   No current facility-administered medications on file prior to visit.   Birth History he was born full-term at [redacted] weeks gestation to G2 P1-0-0-1 mother, via normal spontaneous vaginal delivery with no perinatal events.  his birth weight was 7 lbs.  9 oz.  He did not require a NICU stay.   Developmental history: History of developmental delay, and severe intellectual disability.  Schooling: he attends graduated from [employer]. Day program Monday.    Social and family history: he lives with parents and siblings.  Both parents are in apparent good health. Siblings are also healthy. There is no family history of speech delay, learning difficulties in school, intellectual disability, epilepsy or neuromuscular disorders.   Family History family history includes Diabetes in his paternal grandfather; Hyperlipidemia in his paternal grandmother; Hypertension  in his paternal grandfather.  Review of Systems Constitutional: Negative for fever, malaise/fatigue and weight loss.  HENT: Negative for congestion, ear pain, hearing loss, sinus pain and sore throat.   Eyes: Negative for discharge and redness.  Respiratory: Negative for cough, shortness of breath and wheezing.   Cardiovascular: Negative for chest pain, palpitations and leg swelling.  Gastrointestinal: Negative for abdominal pain, blood in stool,nausea and vomiting. Positive for constipation.  Genitourinary: Negative for dysuria and frequency.  Musculoskeletal: Negative for back pain, falls, joint pain and neck pain.  Skin: Negative for rash.  Neurological: Negative for dizziness, tremors, focal weakness, seizures, weakness and headaches.  Psychiatric/Behavioral: Negative for memory loss. The patient is not nervous/anxious and does not have insomnia.   EXAMINATION Physical examination: Today's Vitals   07/12/21 0951  BP: 92/66  Pulse: 76  Weight: 100 lb (45.4 kg)   Body mass index is 17.16 kg/m.   General examination: he is alert and active in no apparent distress. There are no dysmorphic features. Chest examination reveals normal breath sounds, and normal heart sounds with no cardiac murmur.  Abdominal examination does not show any evidence of hepatic or splenic enlargement, or any abdominal masses or bruits.  G-tube in place and clean skin around G-tube. Has rash in right elbow due to skin friction from chair.  Neurologic examination: he is awake, alert, non verbal. He is in wheelchair.  Cranial nerves: Pupils are equal, symmetric, circular and reactive to light.  He is not tracking object visually. There is no ptosis or nystagmus.  There is no facial  asymmetry, with normal facial movements bilaterally. The tongue is midline.excessive salivation.  Motor assessment:spastic quadriparas throughout with mild joint contractures. Moves his upper extremities against gravity LT>RT./ Deep  tendon reflexes are 3+ and symmetric at the biceps, knees and ankles.  Plantar response is equivocal.  Sensory examination:  react to tactile stimulation.  Co-ordination and gait:  There is no evidence of tremor, dystonic posturing or any abnormal movements.   Wheelchair dependent.   Assessment and Plan [name] is a 26 y.o. Rivers with history of epilepsy, complex cerebral malformation with pachygyria, agenesis of the corpus callosum, hydrocephalus ex vacuo, spastic quadriparesis, severe dysphagia status post feeding gastrostomy, cortical visual impairments, severe intellectual disability, bilateral subluxation of hips, and neuromuscular scoliosis.  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  [name] had couple typical breakthrough seizures and few suspicious seizures lasted briefly for few seconds. Neurological examination is stable. I have increased his keppra to 1200 mg BID ~52 mg/kg/day  PLAN: Increase keppra to 12 ml twice a day.  Referral to physical therapy Follow up with GI specialist Follow up in 6 months Call neurology for any questions or concerns  Counseling/Education: seizure safety.   Total time spent with the patient was 30 minutes, of which 50% or more was spent in counseling and coordination of care.   The plan of care was discussed, with acknowledgement of understanding expressed by his mother.   Franco Nones Neurology and epilepsy attending [facility] Ph. [phone] Fax [phone]

## 2021-07-27 DIAGNOSIS — Z0279 Encounter for issue of other medical certificate: Secondary | ICD-10-CM

## 2021-08-03 ENCOUNTER — Ambulatory Visit: Payer: BC Managed Care – PPO | Attending: Pediatrics | Admitting: Physical Therapy

## 2021-08-06 ENCOUNTER — Telehealth: Payer: Self-pay | Admitting: Pediatrics

## 2021-08-06 NOTE — Telephone Encounter (Signed)
Letter of necessity written

## 2021-08-21 ENCOUNTER — Telehealth (INDEPENDENT_AMBULATORY_CARE_PROVIDER_SITE_OTHER): Payer: Self-pay | Admitting: Pediatrics

## 2021-08-21 NOTE — Telephone Encounter (Signed)
  Name of who is calling:[name]   Caller's Relationship to Patient:Mother   Best contact number:[phone]   Provider they see:Dr.Abdelmoumen   Reason for call:mom called because [name]'s medication has expired and wanted to know if a new script could be called in. Mom stated that she was instructed to call by Dr.A if it was expired      [place]  Name of [place] [address]

## 2021-08-22 NOTE — Telephone Encounter (Signed)
Spoke with pharmacy they state the prescription they have is expired.

## 2021-08-23 MED ORDER — NAYZILAM 5 MG/0.1ML NA SOLN: 5.0000 mg | NASAL | 3 refills | Status: DC | PRN

## 2021-09-04 ENCOUNTER — Telehealth (INDEPENDENT_AMBULATORY_CARE_PROVIDER_SITE_OTHER): Payer: Self-pay | Admitting: Pediatrics

## 2021-09-04 NOTE — Telephone Encounter (Signed)
Who's calling (name and relationship to patient) : Laverna Peace; case management  community support service  Best contact number: [phone]  Provider they see: Dr. Loni Muse  Reason for call: Silva Bandy has called in stating that mom told her that there has been a change in medication; and that she is needing the current physician's order on Rx. Silva Bandy has also stated that our office has a specific form that can be used to fill this out, she mentioned that Otila Kluver knows of this form. Silva Bandy has requested a call back or to fax over info.   Fax #: [phone]   Call ID:      PRESCRIPTION REFILL ONLY  Name of prescription:  Pharmacy:

## 2021-09-07 NOTE — Telephone Encounter (Signed)
Called phyllis to inform that message has been received and tina is out of the office, I will ask her about the form needed when she returns. Will rout message to tina and DR A.  No answer left vm. Not sure if vm was recorded.

## 2021-09-12 ENCOUNTER — Other Ambulatory Visit (INDEPENDENT_AMBULATORY_CARE_PROVIDER_SITE_OTHER): Payer: Self-pay

## 2021-09-12 ENCOUNTER — Telehealth (INDEPENDENT_AMBULATORY_CARE_PROVIDER_SITE_OTHER): Payer: Self-pay | Admitting: Pediatrics

## 2021-09-12 NOTE — Telephone Encounter (Signed)
  Name of who is calling: Jeanie Sewer Relationship to Patient:   Best contact number: [phone]  Provider they see: Dr.A  Reason for call: Silva Bandy is calling to request a Providers Prescription Order. Silva Bandy is requesting a callback.     PRESCRIPTION REFILL ONLY  Name of prescription:  Pharmacy:

## 2021-09-13 NOTE — Telephone Encounter (Signed)
Attempted to call mom 2 times no answer not able to leave vm. Called mom she states that phillips that called is asking for a list of meds Maceo takes. Let mom know that I will called phillis again and get information from her to send over [place].

## 2021-09-14 ENCOUNTER — Ambulatory Visit: Payer: BC Managed Care – PPO | Admitting: Physical Therapy

## 2021-09-18 NOTE — Telephone Encounter (Signed)
Spoke with [name]  she states that they need a prescriber's orders. Provided her with fax number to fax forms over.

## 2021-09-20 NOTE — Telephone Encounter (Signed)
Form received placed on providers desk to be completed.

## 2021-09-21 NOTE — Telephone Encounter (Signed)
Forms have been completed and faxed.

## 2021-10-01 HISTORY — PX: FEMUR FRACTURE SURGERY: SHX633

## 2021-10-18 ENCOUNTER — Telehealth (INDEPENDENT_AMBULATORY_CARE_PROVIDER_SITE_OTHER): Payer: Self-pay | Admitting: Pediatrics
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
## 2021-10-18 NOTE — Telephone Encounter (Signed)
Spoke with mom to let her know that Dr A is out of the office for the week. And let her know I will rout her message to the on call provider. She states she needs to ask medical questions and would like to speak with a provider ASAP.
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
## 2021-10-23 ENCOUNTER — Telehealth (INDEPENDENT_AMBULATORY_CARE_PROVIDER_SITE_OTHER): Payer: Self-pay | Admitting: Pediatrics

## 2021-10-23 ENCOUNTER — Telehealth (INDEPENDENT_AMBULATORY_CARE_PROVIDER_SITE_OTHER): Payer: Self-pay | Admitting: Dietician

## 2021-10-23 DIAGNOSIS — Z931 Gastrostomy status: Secondary | ICD-10-CM
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
## 2021-10-23 NOTE — Telephone Encounter (Signed)
ERROR
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
## 2021-10-24 ENCOUNTER — Encounter (INDEPENDENT_AMBULATORY_CARE_PROVIDER_SITE_OTHER): Payer: Self-pay | Admitting: Dietician

## 2021-10-24 MED ORDER — NUTRITIONAL SUPPLEMENT PLUS PO LIQD: ORAL | 12 refills | Status: DC

## 2021-10-24 MED ORDER — RA NUTRITIONAL SUPPORT PO POWD: ORAL | 12 refills | Status: DC

## 2021-10-24 NOTE — Telephone Encounter (Signed)
Mom reports current regimen:  Formula: Liquid Hope  Current regimen:  Day feeds: 6 oz (180 mL) @ 540 mL/hr x 1 feed (7:30 AM); 12 oz (360 mL) @ 540 mL/hr x 3 feeds (11:30 AM, 3:30 PM, 7:30 PM)  Overnight feeds: none Total Volume: 3.5 pouches total              FWF: 50 mL after each feed (x4); 150 mL (1:30, 5:30); 20 mL (x3) - 560 mL   RD recommended 1 scoop beneprotein per feed for a total of 4 scoops per day. RD set out a few cans of beneprotein for mom to pick up and will put in a new order for 4 scoops of beneprotein and 4 pouches of liquid hope daily to [facility]. RD recommended starting vitamin or supplement containing zinc, vitamin C and vitamin A -- recommendations for 2 packets of Juven daily or 2 flinstones multivitamins crushed given with feeds daily to aid in wound healing. Mom in agreement with plan.

## 2021-10-24 NOTE — Progress Notes (Signed)
RD faxed updated orders for 4 pouches Liquid Hope and 4 scoops beneprotein given daily to [facility] @ [phone].

## 2021-10-26 ENCOUNTER — Telehealth: Payer: Self-pay | Admitting: Pediatrics
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
## 2021-10-27 MED ORDER — IPRATROPIUM BROMIDE 0.02 % IN SOLN: 0.5000 mg | Freq: Four times a day (QID) | RESPIRATORY_TRACT | 12 refills | Status: DC

## 2021-10-27 NOTE — Telephone Encounter (Signed)
Called in atrovent neb solution

## 2021-10-29 ENCOUNTER — Encounter (HOSPITAL_BASED_OUTPATIENT_CLINIC_OR_DEPARTMENT_OTHER): Payer: BC Managed Care – PPO | Attending: Internal Medicine | Admitting: Internal Medicine

## 2021-10-29 DIAGNOSIS — G809 Cerebral palsy, unspecified: Secondary | ICD-10-CM

## 2021-10-29 DIAGNOSIS — L89320 Pressure ulcer of left buttock, unstageable: Secondary | ICD-10-CM | POA: Diagnosis present
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
## 2021-11-02 ENCOUNTER — Telehealth (INDEPENDENT_AMBULATORY_CARE_PROVIDER_SITE_OTHER): Payer: Self-pay | Admitting: Dietician

## 2021-11-02 ENCOUNTER — Telehealth: Payer: Self-pay

## 2021-11-02 DIAGNOSIS — G40309 Generalized idiopathic epilepsy and epileptic syndromes, not intractable, without status epilepticus: Secondary | ICD-10-CM

## 2021-11-02 DIAGNOSIS — G808 Other cerebral palsy: Secondary | ICD-10-CM

## 2021-11-02 DIAGNOSIS — F88 Other disorders of psychological development: Secondary | ICD-10-CM

## 2021-11-02 NOTE — Telephone Encounter (Signed)
RD returned mom's phone call in regards to constipation concern. Mom is unsure whether constipation started before or after beneprotein was introduced. RD instructed mom to decrease beneprotein to 1 scoop per day and work up to 4 scoops per day as tolerated. RD also encouraged mom to increase water as able. RD encouraged mom to call PCP to discuss any further constipation concerns or potential impaction. Mom in agreement with plan.
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
## 2021-11-02 NOTE — Progress Notes (Signed)
[name], [name] ([id number]) [id number] Page 1 of 8 Visit Report for 10/29/2021 Allergy List Details Patient Name: Date of Service: [name], [name] 10/29/2021 9:45 A M Medical Record Number: [id number] Patient Account Number: [id number] Date of Birth/Sex: Treating RN: [birth year] (26 y.o. [name] Primary Care [name]: RA MGO [name], A NDRES Other Clinician: Referring [name]: Treating [name]/Extender: [name] RA MGO O LA M, A NDRES Weeks in Treatment: 0 Allergies Active Allergies Cephalosporins Reaction: neck and face hives Allergy Notes Electronic Signature(s) Signed: 11/01/2021 6:08:05 PM By: [name] RN, BSN Entered By: [name] on 10/26/2021 12:28:47 -------------------------------------------------------------------------------- Arrival Information Details Patient Name: Date of Service: [name], Maxwell Hauser G. 10/29/2021 9:45 A M Medical Record Number: [id number] Patient Account Number: [id number] Date of Birth/Sex: Treating RN: 03-22-1995 (26 y.o. [name] Primary Care [name]: RA MGO [name], A NDRES Other Clinician: Referring [name]: Treating [name]/Extender: [name] RA MGO O LA M, A NDRES Weeks in Treatment: 0 Visit Information Patient Arrived: Wheel Chair Arrival Time: 07:03 Accompanied By: mother Transfer Assistance: Manual Notes *****[name]******** Electronic Signature(s) Signed: 10/29/2021 1:44:39 PM By: [name] RN, BSN Entered By: [name] on 10/29/2021 12:45:36 -------------------------------------------------------------------------------- Clinic Level of Care Assessment Details Patient Name: Date of Service: [name], [name] G. 10/29/2021 9:45 A M Medical Record Number: [id number] Patient Account Number: [id number] [name] ([id number]) [id number] Page 2 of 8 Date of Birth/Sex: Treating RN: 09-Feb-1995 (26 y.o. [name] Primary Care  [name]: Other Clinician: RA MGO [name] NDRES Referring [name]: Treating [name]/Extender: [name] RA MGO O LA M, A NDRES Weeks in Treatment: 0 Clinic Level of Care Assessment Items TOOL 2 Quantity Score X- 1 0 Use when only an EandM is performed on the INITIAL visit ASSESSMENTS - Nursing Assessment / Reassessment X- 1 20 General Physical Exam (combine w/ comprehensive assessment (listed just below) when performed on new pt. evals) X- 1 25 Comprehensive Assessment (HX, ROS, Risk Assessments, Wounds Hx, etc.) ASSESSMENTS - Wound and Skin A ssessment / Reassessment X - Simple Wound Assessment / Reassessment - one wound 1 5 '[]'$  - 0 Complex Wound Assessment / Reassessment - multiple wounds '[]'$  - 0 Dermatologic / Skin Assessment (not related to wound area) ASSESSMENTS - Ostomy and/or Continence Assessment and Care '[]'$  - 0 Incontinence Assessment and Management '[]'$  - 0 Ostomy Care Assessment and Management (repouching, etc.) PROCESS - Coordination of Care X - Simple Patient / Family Education for ongoing care 1 15 '[]'$  - 0 Complex (extensive) Patient / Family Education for ongoing care X- 1 10 Staff obtains Programmer, systems, Records, T Results / Process Orders est '[]'$  - 0 Staff telephones HHA, Nursing Homes / Clarify orders / etc '[]'$  - 0 Routine Transfer to another Facility (non-emergent condition) '[]'$  - 0 Routine Hospital Admission (non-emergent condition) X- 1 15 New Admissions / Biomedical engineer / Ordering NPWT Apligraf, etc. , '[]'$  - 0 Emergency Hospital Admission (emergent condition) '[]'$  - 0 Simple Discharge Coordination '[]'$  - 0 Complex (extensive) Discharge Coordination PROCESS - Special Needs '[]'$  - 0 Pediatric / Minor Patient Management '[]'$  - 0 Isolation Patient Management '[]'$  - 0 Hearing / Language / Visual special needs '[]'$  - 0 Assessment of Community assistance (transportation, D/C planning, etc.) '[]'$  - 0 Additional assistance / Altered mentation '[]'$  -  0 Support Surface(s) Assessment (bed, cushion, seat, etc.) INTERVENTIONS - Wound Cleansing / Measurement X- 1 5 Wound Imaging (photographs - any number of wounds) '[]'$  -  0 Wound Tracing (instead of photographs) X- 1 5 Simple Wound Measurement - one wound '[]'$  - 0 Complex Wound Measurement - multiple wounds X- 1 5 Simple Wound Cleansing - one wound '[]'$  - 0 Complex Wound Cleansing - multiple wounds INTERVENTIONS - Wound Dressings X - Small Wound Dressing one or multiple wounds 1 10 '[]'$  - 0 Medium Wound Dressing one or multiple wounds '[]'$  - 0 Large Wound Dressing one or multiple wounds '[]'$  - 0 Application of Medications - injection [name], [name] ([id number]) [id number] Page 3 of 8 INTERVENTIONS - Miscellaneous '[]'$  - 0 External ear exam '[]'$  - 0 Specimen Collection (cultures, biopsies, blood, body fluids, etc.) '[]'$  - 0 Specimen(s) / Culture(s) sent or taken to Lab for analysis X- 1 10 Patient Transfer (multiple staff / [name] / Similar devices) '[]'$  - 0 Simple Staple / Suture removal (25 or less) '[]'$  - 0 Complex Staple / Suture removal (26 or more) '[]'$  - 0 Hypo / Hyperglycemic Management (close monitor of Blood Glucose) '[]'$  - 0 Ankle / Brachial Index (ABI) - do not check if billed separately Has the patient been seen at the hospital within the last three years: Yes Total Score: 125 Level Of Care: New/Established - Level 4 Electronic Signature(s) Signed: 10/29/2021 1:44:39 PM By: [name] RN, BSN Entered By: [name] on 10/29/2021 12:45:13 -------------------------------------------------------------------------------- Encounter Discharge Information Details Patient Name: Date of Service: Maxwell [name], [name] 10/29/2021 9:45 A M Medical Record Number: [id number] Patient Account Number: [id number] Date of Birth/Sex: Treating RN: [birth year] (26 y.o. [name] Primary Care Amoni Morales: RA MGO [name], A NDRES Other Clinician: Referring  Josemaria Brining: Treating Ennio Houp/Extender: [name] RA MGO O LA M, A NDRES Weeks in Treatment: 0 Encounter Discharge Information Items Discharge Condition: Stable Ambulatory Status: Wheelchair Discharge Destination: Home Transportation: Private Auto Accompanied By: mother Schedule Follow-up Appointment: Yes Clinical Summary of Care: Patient Declined Electronic Signature(s) Signed: 10/29/2021 1:44:39 PM By: [name] RN, BSN Entered By: [name] on 10/29/2021 12:47:03 -------------------------------------------------------------------------------- Lower Extremity Assessment Details Patient Name: Date of Service: [name], Maxwell Hauser G. 10/29/2021 9:45 A M Medical Record Number: [id number] Patient Account Number: [id number] Date of Birth/Sex: Treating RN: 1995/09/03 (26 y.o. [name] Primary Care Cora Brierley: RA MGO [name], A NDRES Other Clinician: Referring Eirik Schueler: Treating Evellyn Tuff/Extender: [name] RA MGO O LA M, A NDRES Weeks in Treatment: 0 Electronic Signature(s) Signed: 10/29/2021 1:44:39 PM By: [name] RN, BSN Entered By: [name] on 10/29/2021 10:35:56 [name] ([id number]) [id number] Page 4 of 8 -------------------------------------------------------------------------------- Multi Wound Chart Details Patient Name: Date of Service: [name], [name] 10/29/2021 9:45 A M Medical Record Number: [id number] Patient Account Number: [id number] Date of Birth/Sex: Treating RN: 25-Mar-1995 (26 y.o. M) Primary Care Nithin Demeo: RA MGO [name], A NDRES Other Clinician: Referring Chauncey Bruno: Treating Hendrix Yurkovich/Extender: [name] RA MGO O LA M, A NDRES Weeks in Treatment: 0 Vital Signs Height(in): Pulse(bpm): 105 Weight(lbs): Blood Pressure(mmHg): 119/77 Body Mass Index(BMI): Temperature(F): 97.7 Respiratory Rate(breaths/min): 16 [1:Photos:] [N/A:N/A] Sacrum N/A N/A Wound Location: Pressure Injury N/A  N/A Wounding Event: Pressure Ulcer N/A N/A Primary Etiology: Quadriplegia, Seizure Disorder N/A N/A Comorbid History: 10/08/2021 N/A N/A Date Acquired: 0 N/A N/A Weeks of Treatment: Open N/A N/A Wound Status: No N/A N/A Wound Recurrence: 5.5x4x0.2 N/A N/A Measurements L x W x D (cm) 17.279 N/A N/A A (cm) : rea 3.456 N/A N/A Volume (cm) : Category/Stage II N/A N/A Classification: Medium N/A  N/A Exudate A mount: Serosanguineous N/A N/A Exudate Type: red, brown N/A N/A Exudate Color: Small (1-33%) N/A N/A Granulation A mount: Red, Pink N/A N/A Granulation Quality: Large (67-100%) N/A N/A Necrotic A mount: Eschar, Adherent Slough N/A N/A Necrotic Tissue: Fat Layer (Subcutaneous Tissue): Yes N/A N/A Exposed Structures: Fascia: No Tendon: No Muscle: No Joint: No Bone: No None N/A N/A Epithelialization: No Abnormalities Noted N/A N/A Periwound Skin Texture: No Abnormalities Noted N/A N/A Periwound Skin Moisture: No Abnormalities Noted N/A N/A Periwound Skin Color: No Abnormality N/A N/A Temperature: Treatment Notes Electronic Signature(s) Signed: 10/29/2021 9:25:17 AM By: Maxwell Shan DO Entered By: [name] on 10/29/2021 11:07:27 [name] ([id number]) [id number] Page 5 of 8 -------------------------------------------------------------------------------- Multi-Disciplinary Care Plan Details Patient Name: Date of Service: [name], [name] 10/29/2021 9:45 A M Medical Record Number: [id number] Patient Account Number: [id number] Date of Birth/Sex: Treating RN: 1995/06/07 (26 y.o. [name] Primary Care Kristyanna Barcelo: RA MGO [name], A NDRES Other Clinician: Referring Jameia Makris: Treating Broedy Osbourne/Extender: [name] RA MGO O LA M, A NDRES Weeks in Treatment: 0 Active Inactive Pressure Nursing Diagnoses: Knowledge deficit related to management of pressures ulcers Goals: Patient will remain free from  development of additional pressure ulcers Date Initiated: 10/29/2021 Target Resolution Date: 11/26/2021 Goal Status: Active Interventions: Assess: immobility, friction, shearing, incontinence upon admission and as needed Assess offloading mechanisms upon admission and as needed Assess potential for pressure ulcer upon admission and as needed Provide education on pressure ulcers Notes: Wound/Skin Impairment Nursing Diagnoses: Impaired tissue integrity Knowledge deficit related to ulceration/compromised skin integrity Goals: Patient/caregiver will verbalize understanding of skin care regimen Date Initiated: 10/29/2021 Target Resolution Date: 11/26/2021 Goal Status: Active Ulcer/skin breakdown will have a volume reduction of 30% by week 4 Date Initiated: 10/29/2021 Target Resolution Date: 11/26/2021 Goal Status: Active Interventions: Assess patient/caregiver ability to obtain necessary supplies Assess patient/caregiver ability to perform ulcer/skin care regimen upon admission and as needed Assess ulceration(s) every visit Provide education on ulcer and skin care Notes: Electronic Signature(s) Signed: 10/29/2021 1:44:39 PM By: [name] RN, BSN Entered By: [name] on 10/29/2021 12:41:29 -------------------------------------------------------------------------------- Pain Assessment Details Patient Name: Date of Service: [name], Maxwell Hauser G. 10/29/2021 9:45 A M Medical Record Number: [id number] Patient Account Number: [id number] Date of Birth/Sex: Treating RN: Apr 28, 1995 (26 y.o. Joylene Igo, [place], [name] ([id number]) [id number] Page 6 of 8 Primary Care Aveah Castell: RA MGO [name], A NDRES Other Clinician: Referring Rashan Patient: Treating Khaylee Mcevoy/Extender: [name] RA MGO O LA M, A NDRES Weeks in Treatment: 0 Active Problems Location of Pain Severity and Description of Pain Patient Has Paino No Site Locations Pain Management and  Medication Current Pain Management: Goals for Pain Management assess pain by facial expression, groaning Electronic Signature(s) Signed: 10/29/2021 1:44:39 PM By: [name] RN, BSN Entered By: [name] on 10/29/2021 10:36:58 -------------------------------------------------------------------------------- Patient/Caregiver Education Details Patient Name: Date of Service: [name] 10/16/2023andnbsp9:45 A M Medical Record Number: [id number] Patient Account Number: [id number] Date of Birth/Gender: Treating RN: 09-09-1995 (26 y.o. [name] Primary Care Physician: RA MGO [name], A NDRES Other Clinician: Referring Physician: Treating Physician/Extender: [name] RA MGO O LA M, A NDRES Weeks in Treatment: 0 Education Assessment Education Provided To: Caregiver Education Topics Provided Pressure: Methods: Explain/Verbal Responses: State content correctly Wound/Skin Impairment: Methods: Demonstration, Explain/Verbal Responses: State content correctly Electronic Signature(s) Signed: 10/29/2021 1:44:39 PM By: [name] RN, BSN [place], [name] ([id number]) [id number] Page 7  of 8 Signed: 10/29/2021 1:44:39 PM By: [name] RN, BSN Entered By: [name] on 10/29/2021 12:41:54 -------------------------------------------------------------------------------- Wound Assessment Details Patient Name: Date of Service: [name], Maxwell Hauser G. 10/29/2021 9:45 A M Medical Record Number: [id number] Patient Account Number: [id number] Date of Birth/Sex: Treating RN: 07/28/1995 (26 y.o. [name] Primary Care Uriel Dowding: RA MGO [name], A NDRES Other Clinician: Referring Josuel Koeppen: Treating [name]/Extender: [name] RA MGO O LA M, A NDRES Weeks in Treatment: 0 Wound Status Wound Number: 1 Primary Etiology: Pressure Ulcer Wound Location: Left [place] Wound Status: Open Wounding Event: Pressure Injury Comorbid History:  Quadriplegia, Seizure Disorder Date Acquired: 10/08/2021 Weeks Of Treatment: 0 Clustered Wound: No Photos Wound Measurements Length: (cm) 5.5 Width: (cm) 4 Depth: (cm) 0.2 Area: (cm) 17.279 Volume: (cm) 3.456 % Reduction in Area: 0% % Reduction in Volume: 0% Epithelialization: None Tunneling: No Undermining: No Wound Description Classification: Category/Stage II Exudate Amount: Medium Exudate Type: Serosanguineous Exudate Color: red, brown Foul Odor After Cleansing: No Slough/Fibrino Yes Wound Bed Granulation Amount: Small (1-33%) Exposed Structure Granulation Quality: Red, Pink Fascia Exposed: No Necrotic Amount: Large (67-100%) Fat Layer (Subcutaneous Tissue) Exposed: Yes Necrotic Quality: Eschar Tendon Exposed: No Muscle Exposed: No Joint Exposed: No Bone Exposed: No Periwound Skin Texture Texture Color No Abnormalities Noted: Yes No Abnormalities Noted: Yes Moisture Temperature / Pain No Abnormalities Noted: Yes Temperature: No Abnormality Treatment Notes Wound #1 (Sacrum) FLORA, RATZ ([id number]) [id number] Page 8 of 8 Cleanser Wound Cleanser Discharge Instruction: Cleanse the wound with wound cleanser prior to applying a clean dressing using gauze sponges, not tissue or cotton balls. Peri-Wound Care Topical Primary Dressing Santyl Ointment Discharge Instruction: Apply nickel thick amount to wound bed as instructed Secondary Dressing ABD Pad, 5x9 Discharge Instruction: Apply over primary dressing as directed. Woven Gauze Sponge, Non-Sterile 4x4 in Discharge Instruction: Apply over primary dressing as directed. Secured With 71M Medipore H Soft Cloth Surgical T ape, 4 x 10 (in/yd) Discharge Instruction: Secure with tape as directed. Compression Wrap Compression Stockings Add-Ons Electronic Signature(s) Signed: 10/29/2021 1:44:39 PM By: [name] RN, BSN Entered By: [name] on 10/29/2021  16:23:32 -------------------------------------------------------------------------------- Vitals Details Patient Name: Date of Service: Maxwell [name], [name] 10/29/2021 9:45 A M Medical Record Number: [id number] Patient Account Number: [id number] Date of Birth/Sex: Treating RN: Aug 26, 1995 (26 y.o. [name] Primary Care Nels Munn: RA MGO [name], A NDRES Other Clinician: Referring Laporcha Marchesi: Treating Jashira Cotugno/Extender: [name] RA MGO O LA M, A NDRES Weeks in Treatment: 0 Vital Signs Time Taken: 10:25 Temperature (F): 97.7 Pulse (bpm): 105 Respiratory Rate (breaths/min): 16 Blood Pressure (mmHg): 119/77 Reference Range: 80 - 120 mg / dl Electronic Signature(s) Signed: 10/29/2021 1:44:39 PM By: [name] RN, BSN Entered By: [name] on 10/29/2021 10:25:28

## 2021-11-02 NOTE — Telephone Encounter (Signed)
  Name of who is calling:  Kizzie Furnish Relationship to Patient: mom   Best contact number: [phone]  Provider they see: Shirlee Limerick   Reason for call: mom is calling in regards to [facility] being constipated and mom is having trouble resolving it. She is asking for a call back. He just recently had surgery maybe a month ago. She stated she's worried about feeding him anything else because she doesn't want him uncomfortable.
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
## 2021-11-05 ENCOUNTER — Ambulatory Visit (HOSPITAL_BASED_OUTPATIENT_CLINIC_OR_DEPARTMENT_OTHER): Payer: BC Managed Care – PPO | Admitting: Internal Medicine

## 2021-11-06 NOTE — Telephone Encounter (Signed)
Attempted to call mom unable to reach.

## 2021-11-06 NOTE — Telephone Encounter (Signed)
Spoke to mom and advised her on the medications to be weaned as well as will reach out to Neurology for further advice. Spoke to [facility] Neurology and Otila Kluver will make a home visit to coordinate care and further management. Will order a wider wheelchair in the meantime

## 2021-11-06 NOTE — Telephone Encounter (Signed)
  Name of who is calling:Michele   Caller's Relationship to Patient:Mother   Best contact number:[phone]   Provider they see:Dr.Abdelmoumen   Reason for call:mom called requesting a call back regarding the medications below and the questions she has about [place] slowly coming off and different side effects. Mom also has questions about these medications and if they will affect his seizure meds. Please call mom back.      PRESCRIPTION REFILL ONLY  Name of prescription:Doxazosin, Mesylate, (Cardura) Eliquis, and Clonidine.   Pharmacy:

## 2021-11-07 NOTE — Telephone Encounter (Signed)
Spoke with mom per Dr A message, she states that she is ok with waiting a couple of days.
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
## 2021-11-12 ENCOUNTER — Ambulatory Visit (HOSPITAL_BASED_OUTPATIENT_CLINIC_OR_DEPARTMENT_OTHER): Payer: BC Managed Care – PPO | Admitting: Internal Medicine

## 2021-11-12 NOTE — Telephone Encounter (Signed)
We called mother to discuss [name] care. He has an appointment with wound care tomorrow. [name] will try to see patient to get health release information signed by mother. Mother states that [name] is improving and they have seen wound care every 2 weeks.   Getting more information to help managing his medications at this point.   Maxwell Nones, MD

## 2021-11-13 ENCOUNTER — Other Ambulatory Visit (INDEPENDENT_AMBULATORY_CARE_PROVIDER_SITE_OTHER): Payer: BC Managed Care – PPO | Admitting: Family

## 2021-11-13 VITALS — HR 80 | Resp 22

## 2021-11-13 DIAGNOSIS — Q040 Congenital malformations of corpus callosum: Secondary | ICD-10-CM

## 2021-11-13 DIAGNOSIS — F73 Profound intellectual disabilities: Secondary | ICD-10-CM

## 2021-11-13 DIAGNOSIS — M4145 Neuromuscular scoliosis, thoracolumbar region: Secondary | ICD-10-CM

## 2021-11-13 DIAGNOSIS — Z7901 Long term (current) use of anticoagulants: Secondary | ICD-10-CM | POA: Diagnosis not present

## 2021-11-13 DIAGNOSIS — L89156 Pressure-induced deep tissue damage of sacral region: Secondary | ICD-10-CM

## 2021-11-13 DIAGNOSIS — Z79899 Other long term (current) drug therapy: Secondary | ICD-10-CM

## 2021-11-13 DIAGNOSIS — G8250 Quadriplegia, unspecified: Secondary | ICD-10-CM

## 2021-11-13 DIAGNOSIS — Q939 Deletion from autosomes, unspecified: Secondary | ICD-10-CM

## 2021-11-13 DIAGNOSIS — Q043 Other reduction deformities of brain: Secondary | ICD-10-CM

## 2021-11-13 DIAGNOSIS — G800 Spastic quadriplegic cerebral palsy: Secondary | ICD-10-CM

## 2021-11-13 DIAGNOSIS — G40219 Localization-related (focal) (partial) symptomatic epilepsy and epileptic syndromes with complex partial seizures, intractable, without status epilepticus: Secondary | ICD-10-CM | POA: Diagnosis not present

## 2021-11-13 DIAGNOSIS — Z931 Gastrostomy status: Secondary | ICD-10-CM

## 2021-11-13 DIAGNOSIS — H479 Unspecified disorder of visual pathways: Secondary | ICD-10-CM
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
## 2021-11-14 MED ORDER — BACLOFEN 5 MG PO TABS: 1.0000 | ORAL_TABLET | Freq: Three times a day (TID) | ORAL | 1 refills | Status: DC

## 2021-11-14 MED ORDER — ELIQUIS 2.5 MG PO TABS: 2.5000 mg | ORAL_TABLET | Freq: Two times a day (BID) | ORAL | 1 refills | Status: DC

## 2021-11-14 NOTE — Patient Instructions (Signed)
Thank you for allowing me to see Colbie in your home today.   Instructions until your next appointment are as follows: I will order an ultrasound of his lower legs to check for a DVT (deep vein thrombosis). If that is normal, we will make a plan to stop the Eliquis.  Continue giving the Eliquis for now Continue giving the Baclofen for now. We will plan to taper and discontinue in the next month or so.  Continue the Levetiracetam at 28m twice per day for now. We may slowly reduce that dose as well if MCheronremains seizure free.  I will return to see MVineetin about 4 weeks or sooner if needed.   At [facility], we are committed to providing exceptional care. You will receive a patient satisfaction survey through text or email regarding your visit today. Your opinion is important to me. Comments are appreciated.   Feel free to contact our office during normal business hours at [phone] questions or concerns. If there is no answer or the call is outside business hours, please leave a message and our clinic staff will call you back within the next business day.  If you have an urgent concern, please stay on the line for our after-hours answering service and ask for the on-call neurologist.     I also encourage you to use MyChart to communicate with me more directly. If you have not yet signed up for [facility] the front desk staff can help you. However, please note that this inbox is NOT monitored on nights or weekends, and response can take up to 2 business days.  Urgent matters should be discussed with the on-call pediatric neurologist.

## 2021-11-15 ENCOUNTER — Encounter (HOSPITAL_COMMUNITY): Payer: Self-pay | Admitting: Emergency Medicine

## 2021-11-15 ENCOUNTER — Emergency Department (HOSPITAL_COMMUNITY)
Admission: EM | Admit: 2021-11-15 | Discharge: 2021-11-16 | Disposition: A | Discharge status: Home / Self Care | Payer: BC Managed Care – PPO | Attending: Emergency Medicine | Admitting: Emergency Medicine

## 2021-11-15 ENCOUNTER — Encounter (HOSPITAL_BASED_OUTPATIENT_CLINIC_OR_DEPARTMENT_OTHER): Payer: BC Managed Care – PPO | Attending: Internal Medicine | Admitting: Internal Medicine

## 2021-11-15 ENCOUNTER — Other Ambulatory Visit: Payer: Self-pay

## 2021-11-15 DIAGNOSIS — M4628 Osteomyelitis of vertebra, sacral and sacrococcygeal region: Secondary | ICD-10-CM | POA: Diagnosis not present

## 2021-11-15 DIAGNOSIS — Z79899 Other long term (current) drug therapy: Secondary | ICD-10-CM | POA: Insufficient documentation

## 2021-11-15 DIAGNOSIS — L89320 Pressure ulcer of left buttock, unstageable: Secondary | ICD-10-CM | POA: Insufficient documentation

## 2021-11-15 DIAGNOSIS — G809 Cerebral palsy, unspecified: Secondary | ICD-10-CM | POA: Insufficient documentation

## 2021-11-15 DIAGNOSIS — Z7901 Long term (current) use of anticoagulants: Secondary | ICD-10-CM | POA: Insufficient documentation

## 2021-11-15 DIAGNOSIS — L89159 Pressure ulcer of sacral region, unspecified stage: Secondary | ICD-10-CM | POA: Diagnosis present

## 2021-11-15 DIAGNOSIS — L98423 Non-pressure chronic ulcer of back with necrosis of muscle: Secondary | ICD-10-CM
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
## 2021-11-15 NOTE — ED Triage Notes (Addendum)
Patient arrived with EMS from home reports bleeding at right buttocks pressure ulcer this evening , debrided at wound care center today , dressing applied prior to arrival . Patient takes Eliquis.
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
## 2021-11-15 NOTE — Progress Notes (Signed)
Maxwell, Rivers ([id number]) [id number] Page 1 of 7 Visit Report for 11/15/2021 Arrival Information Details Patient Name: Date of Service: [name], [place] 11/15/2021 1:15 PM Medical Record Number: [id number] Patient Account Number: [id number] Date of Birth/Sex: Treating RN: Jun 19, 1995 (26 y.o. Janyth Contes Primary Care Corra Kaine: [name] Other Clinician: Referring Iretha Kirley: Treating Kalvin Buss/Extender: Kathy Breach in Treatment: 2 Visit Information History Since Last Visit Added or deleted any medications: No Patient Arrived: Wheel Chair Any new allergies or adverse reactions: No Arrival Time: 13:45 Had a fall or experienced change in No Accompanied By: mother activities of daily living that may affect Transfer Assistance: Manual risk of falls: Patient Identification Verified: Yes Signs or symptoms of abuse/neglect since last visito No Secondary Verification Process Completed: Yes Hospitalized since last visit: No Patient Requires Transmission-Based Precautions: No Implantable device outside of the clinic excluding No Patient Has Alerts: No cellular tissue based products placed in the center since last visit: Has Dressing in Place as Prescribed: Yes Pain Present Now: Unable to Respond Electronic Signature(s) Signed: 11/15/2021 4:28:08 PM By: Adline Peals Entered By: Adline Peals on 11/15/2021 13:53:29 -------------------------------------------------------------------------------- Encounter Discharge Information Details Patient Name: Date of Service: Maxwell [name], [name] 11/15/2021 1:15 PM Medical Record Number: [id number] Patient Account Number: [id number] Date of Birth/Sex: Treating RN: [birth year] (26 y.o. Janyth Contes Primary Care Eloisa Rivers: [name] Other Clinician: Referring Quinlan Vollmer: Treating Andreina Outten/Extender: Kathy Breach in Treatment: 2 Encounter  Discharge Information Items Post Procedure Vitals Discharge Condition: Stable Temperature (F): 97.9 Ambulatory Status: Wheelchair Pulse (bpm): 116 Discharge Destination: Home Respiratory Rate (breaths/min): 18 Transportation: Private Auto Blood Pressure (mmHg): 118/78 Accompanied By: mother Schedule Follow-up Appointment: Yes Clinical Summary of Care: Patient Declined Electronic Signature(s) Signed: 11/15/2021 4:28:08 PM By: Adline Peals Entered By: Adline Peals on 11/15/2021 14:14:11 Ardelle Anton ([id number]) [id number] Page 2 of 7 -------------------------------------------------------------------------------- Lower Extremity Assessment Details Patient Name: Date of Service: [name], [place] 11/15/2021 1:15 PM Medical Record Number: [id number] Patient Account Number: [id number] Date of Birth/Sex: Treating RN: 05-17-1995 (26 y.o. Janyth Contes Primary Care Treysen Sudbeck: [name] Other Clinician: Referring Dallana Mavity: Treating Chrisanne Loose/Extender: Kathy Breach in Treatment: 2 Electronic Signature(s) Signed: 11/15/2021 4:28:08 PM By: Adline Peals Entered By: Adline Peals on 11/15/2021 13:53:53 -------------------------------------------------------------------------------- Multi Wound Chart Details Patient Name: Date of Service: Maxwell [name], [name] 11/15/2021 1:15 PM Medical Record Number: [id number] Patient Account Number: [id number] Date of Birth/Sex: Treating RN: 1995/07/06 (26 y.o. M) Primary Care Infiniti Hoefling: [name] Other Clinician: Referring [name]: Treating Gilliam Hawkes/Extender: Kathy Breach in Treatment: 2 Vital Signs Height(in): Pulse(bpm): 116 Weight(lbs): Blood Pressure(mmHg): 118/78 Body Mass Index(BMI): Temperature(F): 97.9 Respiratory Rate(breaths/min): 18 [1:Photos:] [N/A:N/A] Left Ilium N/A N/A Wound Location: Pressure Injury N/A  N/A Wounding Event: Pressure Ulcer N/A N/A Primary Etiology: Quadriplegia, Seizure Disorder N/A N/A Comorbid History: 10/08/2021 N/A N/A Date Acquired: 2 N/A N/A Weeks of Treatment: Open N/A N/A Wound Status: No N/A N/A Wound Recurrence: 5x3.5x1 N/A N/A Measurements L x W x D (cm) 13.744 N/A N/A A (cm) : rea 13.744 N/A N/A Volume (cm) : 20.50% N/A N/A % Reduction in A rea: -297.70% N/A N/A % Reduction in Volume: Category/Stage II N/A N/A Classification: Medium N/A N/A Exudate A mount: Serosanguineous N/A N/A Exudate Type: red, brown N/A N/A Exudate Color: Distinct, outline attached N/A N/A Wound Margin: Small (1-33%) N/A N/A Granulation A mount: Red, Pink N/A N/A Granulation Quality: Large (67-100%) N/A  N/A Necrotic A mount: Bee N/A N/A Necrotic Tissue: ONEIL, BEHNEY ([id number]) [id number] Page 3 of 7 Fat Layer (Subcutaneous Tissue): Yes N/A N/A Exposed Structures: Fascia: No Tendon: No Muscle: No Joint: No Bone: No None N/A N/A Epithelialization: Debridement - Excisional N/A N/A Debridement: Pre-procedure Verification/Time Out 14:08 N/A N/A Taken: Lidocaine 4% Topical Solution N/A N/A Pain Control: Fat, Subcutaneous N/A N/A Tissue Debrided: Skin/Subcutaneous Tissue N/A N/A Level: 17.5 N/A N/A Debridement A (sq cm): rea Forceps, Scissors N/A N/A Instrument: Minimum N/A N/A Bleeding: Pressure N/A N/A Hemostasis A chieved: Procedure was tolerated well N/A N/A Debridement Treatment Response: 5x3.5x1 N/A N/A Post Debridement Measurements L x W x D (cm) 13.744 N/A N/A Post Debridement Volume: (cm) Category/Stage II N/A N/A Post Debridement Stage: No Abnormalities Noted N/A N/A Periwound Skin Texture: No Abnormalities Noted N/A N/A Periwound Skin Moisture: No Abnormalities Noted N/A N/A Periwound Skin Color: No Abnormality N/A N/A Temperature: Debridement N/A N/A Procedures  Performed: Treatment Notes Wound #1 (Ilium) Wound Laterality: Left Cleanser Wound Cleanser Discharge Instruction: Cleanse the wound with wound cleanser prior to applying a clean dressing using gauze sponges, not tissue or cotton balls. Peri-Wound Care Topical Primary Dressing Santyl Ointment Discharge Instruction: Apply nickel thick amount to wound bed as instructed Secondary Dressing ABD Pad, 5x9 Discharge Instruction: Apply over primary dressing as directed. Woven Gauze Sponge, Non-Sterile 4x4 in Discharge Instruction: Apply over primary dressing as directed. Secured With 91M Medipore H Soft Cloth Surgical T ape, 4 x 10 (in/yd) Discharge Instruction: Secure with tape as directed. Compression Wrap Compression Stockings Add-Ons Electronic Signature(s) Signed: 11/15/2021 4:03:30 PM By: Linton Ham MD Entered By: Linton Ham on 11/15/2021 14:21:02 -------------------------------------------------------------------------------- Multi-Disciplinary Care Plan Details Patient Name: Date of Service: Maxwell [name], [name] 11/15/2021 1:15 PM Medical Record Number: [id number] Patient Account Number: [id number] Date of Birth/Sex: Treating RN: 02-10-1995 (26 y.o. Janyth Contes Primary Care Alease Fait: [name] Other Clinician: JAXZEN, VANHORN ([id number]) [id number] Page 4 of 7 Referring Dameshia Seybold: Treating Oluwaseyi Tull/Extender: Kathy Breach in Treatment: 2 Active Inactive Pressure Nursing Diagnoses: Knowledge deficit related to management of pressures ulcers Goals: Patient will remain free from development of additional pressure ulcers Date Initiated: 10/29/2021 Target Resolution Date: 01/11/2022 Goal Status: Active Interventions: Assess: immobility, friction, shearing, incontinence upon admission and as needed Assess offloading mechanisms upon admission and as needed Assess potential for pressure ulcer upon admission  and as needed Provide education on pressure ulcers Notes: Wound/Skin Impairment Nursing Diagnoses: Impaired tissue integrity Knowledge deficit related to ulceration/compromised skin integrity Goals: Patient/caregiver will verbalize understanding of skin care regimen Date Initiated: 10/29/2021 Target Resolution Date: 01/11/2022 Goal Status: Active Ulcer/skin breakdown will have a volume reduction of 30% by week 4 Date Initiated: 10/29/2021 Target Resolution Date: 01/11/2022 Goal Status: Active Interventions: Assess patient/caregiver ability to obtain necessary supplies Assess patient/caregiver ability to perform ulcer/skin care regimen upon admission and as needed Assess ulceration(s) every visit Provide education on ulcer and skin care Notes: Electronic Signature(s) Signed: 11/15/2021 4:28:08 PM By: Adline Peals Entered By: Adline Peals on 11/15/2021 14:11:25 -------------------------------------------------------------------------------- Pain Assessment Details Patient Name: Date of Service: [name], [name] Neither 11/15/2021 1:15 PM Medical Record Number: [id number] Patient Account Number: [id number] Date of Birth/Sex: Treating RN: 02-10-1995 (26 y.o. Janyth Contes Primary Care Jahaira Earnhart: [name] Other Clinician: Referring Myron Stankovich: Treating Kierra Jezewski/Extender: Kathy Breach in Treatment: 2 Active Problems Location of Pain Severity and Description of Pain Patient Has Paino Patient Unable to Respond  [address] [place], [place] ([id number]) [id number] Page 5 of 7 Pain Management and Medication Current Pain Management: Electronic Signature(s) Signed: 11/15/2021 4:28:08 PM By: Adline Peals Entered By: Adline Peals on 11/15/2021 13:53:43 -------------------------------------------------------------------------------- Patient/Caregiver Education Details Patient Name: Date of Service: [name],  [name] Neither 11/2/2023andnbsp1:15 PM Medical Record Number: [id number] Patient Account Number: [id number] Date of Birth/Gender: Treating RN: 1995-05-05 (26 y.o. Janyth Contes Primary Care Physician: [name] Other Clinician: Referring Physician: Treating Physician/Extender: Kathy Breach in Treatment: 2 Education Assessment Education Provided To: Patient Education Topics Provided Wound/Skin Impairment: Methods: Explain/Verbal Responses: Reinforcements needed, State content correctly Electronic Signature(s) Signed: 11/15/2021 4:28:08 PM By: Adline Peals Entered By: Adline Peals on 11/15/2021 14:11:36 -------------------------------------------------------------------------------- Wound Assessment Details Patient Name: Date of Service: [name], [name] Neither 11/15/2021 1:15 PM Medical Record Number: [id number] Patient Account Number: [id number] Date of Birth/Sex: Treating RN: 08-19-1995 (26 y.o. Janyth Contes Primary Care Kymora Sciara: [name] Other Clinician: Referring Jonn Chaikin: Treating Joshoa Shawler/Extender: Lavinia Sharps ([id number]) [id number] Page 6 of 7 Weeks in Treatment: 2 Wound Status Wound Number: 1 Primary Etiology: Pressure Ulcer Wound Location: Left Ilium Wound Status: Open Wounding Event: Pressure Injury Comorbid History: Quadriplegia, Seizure Disorder Date Acquired: 10/08/2021 Weeks Of Treatment: 2 Clustered Wound: No Photos Wound Measurements Length: (cm) 5 Width: (cm) 3.5 Depth: (cm) 1 Area: (cm) 13.744 Volume: (cm) 13.744 % Reduction in Area: 20.5% % Reduction in Volume: -297.7% Epithelialization: None Tunneling: No Undermining: No Wound Description Classification: Unstageable/Unclassified Wound Margin: Distinct, outline attached Exudate Amount: Medium Exudate Type: Serosanguineous Exudate Color: red, brown Foul Odor After  Cleansing: No Slough/Fibrino Yes Wound Bed Granulation Amount: Small (1-33%) Exposed Structure Granulation Quality: Red, Pink Fascia Exposed: No Necrotic Amount: Large (67-100%) Fat Layer (Subcutaneous Tissue) Exposed: Yes Necrotic Quality: Eschar, Adherent Slough Tendon Exposed: No Muscle Exposed: No Joint Exposed: No Bone Exposed: No Periwound Skin Texture Texture Color No Abnormalities Noted: Yes No Abnormalities Noted: Yes Moisture Temperature / Pain No Abnormalities Noted: Yes Temperature: No Abnormality Treatment Notes Wound #1 (Ilium) Wound Laterality: Left Cleanser Wound Cleanser Discharge Instruction: Cleanse the wound with wound cleanser prior to applying a clean dressing using gauze sponges, not tissue or cotton balls. Peri-Wound Care Topical Primary Dressing Santyl Ointment Discharge Instruction: Apply nickel thick amount to wound bed as instructed Secondary Dressing ABD Pad, 5x9 Discharge Instruction: Apply over primary dressing as directed. Woven Gauze Sponge, Non-Sterile 4x4 in DIONTAE, ROUTE ([id number]) [id number] Page 7 of 7 Discharge Instruction: Apply over primary dressing as directed. Secured With 16M Medipore H Soft Cloth Surgical T ape, 4 x 10 (in/yd) Discharge Instruction: Secure with tape as directed. Compression Wrap Compression Stockings Add-Ons Electronic Signature(s) Signed: 11/15/2021 4:28:08 PM By: Adline Peals Entered By: Adline Peals on 11/15/2021 14:59:08 -------------------------------------------------------------------------------- Vitals Details Patient Name: Date of Service: Maxwell [name], [name] 11/15/2021 1:15 PM Medical Record Number: [id number] Patient Account Number: [id number] Date of Birth/Sex: Treating RN: Dec 07, 1995 (26 y.o. Janyth Contes Primary Care Clarabel Marion: [name] Other Clinician: Referring Doaa Kendzierski: Treating Tellis Spivak/Extender: Kathy Breach in Treatment: 2 Vital Signs Time Taken: 13:58 Temperature (F): 97.9 Pulse (bpm): 116 Respiratory Rate (breaths/min): 18 Blood Pressure (mmHg): 118/78 Reference Range: 80 - 120 mg / dl Electronic Signature(s) Signed: 11/15/2021 4:28:08 PM By: Adline Peals Entered By: Adline Peals on 11/15/2021 13:59:10

## 2021-11-16 ENCOUNTER — Ambulatory Visit (HOSPITAL_BASED_OUTPATIENT_CLINIC_OR_DEPARTMENT_OTHER)
Admission: RE | Admit: 2021-11-16 | Discharge: 2021-11-16 | Disposition: A | Discharge status: Home / Self Care | Payer: BC Managed Care – PPO | Source: Ambulatory Visit | Attending: Family | Admitting: Family

## 2021-11-16 ENCOUNTER — Other Ambulatory Visit: Payer: BC Managed Care – PPO

## 2021-11-16 ENCOUNTER — Telehealth (INDEPENDENT_AMBULATORY_CARE_PROVIDER_SITE_OTHER): Payer: Self-pay | Admitting: Family

## 2021-11-16 DIAGNOSIS — Z79899 Other long term (current) drug therapy: Secondary | ICD-10-CM | POA: Insufficient documentation

## 2021-11-16 LAB — TYPE AND SCREEN
ABO/RH(D): O POS
Antibody Screen: NEGATIVE

## 2021-11-16 LAB — CBC WITH DIFFERENTIAL/PLATELET
Abs Immature Granulocytes: 0.02 10*3/uL (ref 0.00–0.07)
Basophils Absolute: 0.1 10*3/uL (ref 0.0–0.1)
Basophils Relative: 1 %
Eosinophils Absolute: 0.1 10*3/uL (ref 0.0–0.5)
Eosinophils Relative: 1 %
HCT: 39.4 % (ref 39.0–52.0)
Hemoglobin: 12.8 g/dL — ABNORMAL LOW (ref 13.0–17.0)
Immature Granulocytes: 0 %
Lymphocytes Relative: 18 %
Lymphs Abs: 1.3 10*3/uL (ref 0.7–4.0)
MCH: 29.2 pg (ref 26.0–34.0)
MCHC: 32.5 g/dL (ref 30.0–36.0)
MCV: 89.7 fL (ref 80.0–100.0)
Monocytes Absolute: 0.5 10*3/uL (ref 0.1–1.0)
Monocytes Relative: 7 %
Neutro Abs: 5.1 10*3/uL (ref 1.7–7.7)
Neutrophils Relative %: 73 %
Platelets: 319 10*3/uL (ref 150–400)
RBC: 4.39 MIL/uL (ref 4.22–5.81)
RDW: 14.4 % (ref 11.5–15.5)
WBC: 7.1 10*3/uL (ref 4.0–10.5)
nRBC: 0 % (ref 0.0–0.2)

## 2021-11-16 LAB — ABO/RH: ABO/RH(D): O POS

## 2021-11-16 MED ORDER — CLONIDINE HCL 0.1 MG PO TABS
0.1000 mg | ORAL_TABLET | Freq: Once | ORAL | Status: AC
  Administered 2021-11-16: 0.1 mg
  Filled 2021-11-16: qty 1

## 2021-11-16 MED ORDER — TRAMADOL HCL 50 MG PO TABS: 50.0000 mg | ORAL_TABLET | Freq: Once | ORAL | Status: DC

## 2021-11-16 MED ORDER — APIXABAN 2.5 MG PO TABS: 2.5000 mg | ORAL_TABLET | Freq: Two times a day (BID) | ORAL | Status: DC

## 2021-11-16 MED ORDER — BACLOFEN 10 MG PO TABS: 5.0000 mg | ORAL_TABLET | Freq: Three times a day (TID) | ORAL | Status: DC

## 2021-11-16 MED ORDER — GELATIN ABSORBABLE 12-7 MM EX MISC: 1.0000 | Freq: Once | CUTANEOUS | Status: DC

## 2021-11-16 MED ORDER — IBUPROFEN 400 MG PO TABS
400.0000 mg | ORAL_TABLET | Freq: Once | ORAL | Status: AC
  Administered 2021-11-16: 400 mg via ORAL
  Filled 2021-11-16: qty 1

## 2021-11-16 MED ORDER — LEVETIRACETAM 100 MG/ML PO SOLN
1000.0000 mg | Freq: Once | ORAL | Status: AC
  Administered 2021-11-16: 1000 mg
  Filled 2021-11-16: qty 10

## 2021-11-16 NOTE — ED Notes (Signed)
[facility] called  

## 2021-11-16 NOTE — Telephone Encounter (Signed)
I called ultrasound results to Mom and instructed her to stop Eliquis. He had bleeding from a pressure wound last night and was seen in the ED. Mom has not seen any bleeding from that area today. TG

## 2021-11-16 NOTE — Telephone Encounter (Signed)
  Name of who is calling: Shon Millet Relationship to Patient: Mother/guardian  Best contact number: [phone]  Provider they see: Rockwell Germany, NP  Reason for call: Checking status of ultrasound of legs to check for blood clots. Mom was advised by other providers to discontinue Eliquis, but [place] needs to be performed first.      PRESCRIPTION REFILL ONLY  Name of prescription:  Pharmacy:

## 2021-11-16 NOTE — Discharge Instructions (Signed)
Please return for recurrent bleeding.  Please follow-up with the wound care clinic and the PCP.

## 2021-11-16 NOTE — Telephone Encounter (Signed)
Spoke with [facility], was able to schedule patient at 11 am today. Spoke with mom let her know, also provided phone number to reschedule in case they are not able to make it in today.
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
## 2021-11-16 NOTE — ED Notes (Addendum)
This RN, Hal Hope RN, and Dr Tyrone Nine to roll pt to visualize decub ulcer. Previous dressing removed by DO. Pt had small BM. Pt cleaned and placed into clean brief. New dressing applied over ulcer

## 2021-11-18 ENCOUNTER — Encounter (INDEPENDENT_AMBULATORY_CARE_PROVIDER_SITE_OTHER): Payer: Self-pay | Admitting: Family

## 2021-11-18 DIAGNOSIS — Z79899 Other long term (current) drug therapy: Secondary | ICD-10-CM | POA: Insufficient documentation

## 2021-11-18 DIAGNOSIS — L89156 Pressure-induced deep tissue damage of sacral region: Secondary | ICD-10-CM | POA: Insufficient documentation

## 2021-11-18 DIAGNOSIS — G40219 Localization-related (focal) (partial) symptomatic epilepsy and epileptic syndromes with complex partial seizures, intractable, without status epilepticus: Secondary | ICD-10-CM | POA: Insufficient documentation

## 2021-11-18 DIAGNOSIS — M4145 Neuromuscular scoliosis, thoracolumbar region: Secondary | ICD-10-CM | POA: Insufficient documentation

## 2021-11-21 ENCOUNTER — Telehealth (INDEPENDENT_AMBULATORY_CARE_PROVIDER_SITE_OTHER): Payer: Self-pay | Admitting: Pediatrics

## 2021-11-21 DIAGNOSIS — G40309 Generalized idiopathic epilepsy and epileptic syndromes, not intractable, without status epilepticus: Secondary | ICD-10-CM

## 2021-11-21 MED ORDER — LEVETIRACETAM 100 MG/ML PO SOLN: ORAL | 5 refills | Status: DC

## 2021-11-21 NOTE — Telephone Encounter (Signed)
Best contact number: [phone]  Provider they see: Dr. Loni Muse  Reason for call: [facility] called stating mom told them pt is taking 15 ml BID. They are asking for updated prescription with updated quantity and directions. This is the [facility] on [address].

## 2021-11-22 ENCOUNTER — Ambulatory Visit (INDEPENDENT_AMBULATORY_CARE_PROVIDER_SITE_OTHER): Payer: BC Managed Care – PPO | Admitting: Dietician

## 2021-11-22 ENCOUNTER — Encounter (INDEPENDENT_AMBULATORY_CARE_PROVIDER_SITE_OTHER): Payer: Self-pay | Admitting: Dietician

## 2021-11-22 DIAGNOSIS — Z931 Gastrostomy status: Secondary | ICD-10-CM | POA: Diagnosis not present

## 2021-11-22 DIAGNOSIS — R638 Other symptoms and signs concerning food and fluid intake: Secondary | ICD-10-CM

## 2021-11-22 MED ORDER — NUTRITIONAL SUPPLEMENT PLUS PO LIQD: ORAL | 12 refills | Status: AC

## 2021-11-22 NOTE — Progress Notes (Signed)
RD faxed updated orders for 4 pouches Liquid Hope Peptide to [facility] @ [phone].

## 2021-11-22 NOTE — Patient Instructions (Addendum)
Nutrition Recommendations sent to [email or web address]:  - Goal for about 1000-1200 mL of free water flushes in addition to feeds to meet his hydration goal.  - Let's work on transitioning [name] to [place] to enhance absorption of nutrition. I will update this with [facility].  - Continue working up to 4 pouches per day.  - Discontinue beneprotein with this new regimen.  - Feel free to decrease Juven to 1 packet per day or substitute with two flinstone complete multivitamins.   Next appointment with [name] is January 4th @ 9:30 AM.

## 2021-11-29 ENCOUNTER — Other Ambulatory Visit: Payer: Self-pay

## 2021-11-29 ENCOUNTER — Telehealth: Payer: Self-pay | Admitting: Pediatrics

## 2021-11-29 ENCOUNTER — Encounter (HOSPITAL_BASED_OUTPATIENT_CLINIC_OR_DEPARTMENT_OTHER): Payer: BC Managed Care – PPO | Admitting: Internal Medicine

## 2021-11-29 DIAGNOSIS — L89320 Pressure ulcer of left buttock, unstageable: Secondary | ICD-10-CM | POA: Diagnosis not present

## 2021-11-29 NOTE — Telephone Encounter (Signed)
Letter of medical necessity for Liquid Hope ---4 pouches per day---620 per month
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
## 2021-11-30 NOTE — Progress Notes (Signed)
PORFIRIO, BOLLIER ([id number]) [id number] Page 1 of 7 Visit Report for 11/29/2021 Arrival Information Details Patient Name: Date of Service: [name], [place] 11/29/2021 12:45 PM Medical Record Number: [id number] Patient Account Number: [id number] Date of Birth/Sex: Treating RN: August 17, 1995 (26 y.o. Janyth Contes Primary Care Aniza Shor: [name] Other Clinician: Referring Yvonda Fouty: Treating Kreig Parson/Extender: Thereasa Parkin in Treatment: 4 Visit Information History Since Last Visit Added or deleted any medications: No Patient Arrived: Wheel Chair Any new allergies or adverse reactions: No Arrival Time: 13:15 Had a fall or experienced change in No Accompanied By: mother activities of daily living that may affect Transfer Assistance: None risk of falls: Patient Identification Verified: Yes Signs or symptoms of abuse/neglect since last visito No Secondary Verification Process Completed: Yes Hospitalized since last visit: No Patient Requires Transmission-Based Precautions: No Implantable device outside of the clinic excluding No Patient Has Alerts: No cellular tissue based products placed in the center since last visit: Has Dressing in Place as Prescribed: Yes Pain Present Now: Unable to Respond Electronic Signature(s) Signed: 11/29/2021 4:44:04 PM By: Adline Peals Entered By: Adline Peals on 11/29/2021 13:15:40 -------------------------------------------------------------------------------- Encounter Discharge Information Details Patient Name: Date of Service: Maxwell [name], [name] 11/29/2021 12:45 PM Medical Record Number: [id number] Patient Account Number: [id number] Date of Birth/Sex: Treating RN: 11-Jun-1995 (26 y.o. Burnadette Pop, Lauren Primary Care Zakari Couchman: [name] Other Clinician: Referring Chistine Dematteo: Treating Ahilyn Nell/Extender: Thereasa Parkin in Treatment:  4 Encounter Discharge Information Items Post Procedure Vitals Discharge Condition: Stable Temperature (F): 98.7 Ambulatory Status: Wheelchair Pulse (bpm): 74 Discharge Destination: Home Respiratory Rate (breaths/min): 17 Transportation: Private Auto Blood Pressure (mmHg): 120/80 Accompanied By: mom Schedule Follow-up Appointment: Yes Clinical Summary of Care: Patient Declined Electronic Signature(s) Signed: 11/30/2021 12:09:11 PM By: Rhae Hammock RN Entered By: Rhae Hammock on 11/29/2021 14:13:07 Ardelle Anton ([id number]) [id number] Page 2 of 7 -------------------------------------------------------------------------------- Lower Extremity Assessment Details Patient Name: Date of Service: [name], [place] 11/29/2021 12:45 PM Medical Record Number: [id number] Patient Account Number: [id number] Date of Birth/Sex: Treating RN: 08-Dec-1995 (26 y.o. Janyth Contes Primary Care Lyndy Russman: [name] Other Clinician: Referring Richrd Kuzniar: Treating Anicka Stuckert/Extender: Thereasa Parkin in Treatment: 4 Electronic Signature(s) Signed: 11/29/2021 4:44:04 PM By: Sabas Sous By: Adline Peals on 11/29/2021 13:15:50 -------------------------------------------------------------------------------- Multi Wound Chart Details Patient Name: Date of Service: Maxwell [name], [name] 11/29/2021 12:45 PM Medical Record Number: [id number] Patient Account Number: [id number] Date of Birth/Sex: Treating RN: [birth year] (26 y.o. M) Primary Care Jamire Shabazz: [name] Other Clinician: Referring Amia Rynders: Treating Guilherme Schwenke/Extender: Thereasa Parkin in Treatment: 4 Vital Signs Height(in): Pulse(bpm): 57 Weight(lbs): Blood Pressure(mmHg): 122/81 Body Mass Index(BMI): Temperature(F): 98.8 Respiratory Rate(breaths/min): 18 [1:Photos:] [N/A:N/A] Left Ilium N/A N/A Wound  Location: Pressure Injury N/A N/A Wounding Event: Pressure Ulcer N/A N/A Primary Etiology: Quadriplegia, Seizure Disorder N/A N/A Comorbid History: 10/08/2021 N/A N/A Date Acquired: 4 N/A N/A Weeks of Treatment: Open N/A N/A Wound Status: No N/A N/A Wound Recurrence: 4.3x3.4x0.7 N/A N/A Measurements L x W x D (cm) 11.483 N/A N/A A (cm) : rea 8.038 N/A N/A Volume (cm) : 33.50% N/A N/A % Reduction in A rea: -132.60% N/A N/A % Reduction in Volume: 9 Starting Position 1 (o'clock): 3 Ending Position 1 (o'clock): 1.4 Maximum Distance 1 (cm): Yes N/A N/A Undermining: Unstageable/Unclassified N/A N/A Classification: Medium N/A N/A Exudate A mount: Serosanguineous N/A N/A Exudate Type: red, brown N/A N/A Exudate Color: Distinct, outline attached N/A  N/A Wound Margin: GLENWOOD, REVOIR ([id number]) [id number] Page 3 of 7 Medium (34-66%) N/A N/A Granulation Amount: Medium (34-66%) N/A N/A Necrotic Amount: Eschar, Adherent Slough N/A N/A Necrotic Tissue: Fat Layer (Subcutaneous Tissue): Yes N/A N/A Exposed Structures: Bone: Yes Fascia: No Tendon: No Muscle: No Joint: No None N/A N/A Epithelialization: No Abnormalities Noted N/A N/A Periwound Skin Texture: Maceration: Yes N/A N/A Periwound Skin Moisture: No Abnormalities Noted N/A N/A Periwound Skin Color: No Abnormality N/A N/A Temperature: Treatment Notes Electronic Signature(s) Signed: 11/29/2021 5:05:23 PM By: Kalman Shan DO Entered By: Kalman Shan on 11/29/2021 14:04:05 -------------------------------------------------------------------------------- Multi-Disciplinary Care Plan Details Patient Name: Date of Service: [name], [name] 11/29/2021 12:45 PM Medical Record Number: [id number] Patient Account Number: [id number] Date of Birth/Sex: Treating RN: [birth year] (26 y.o. Burnadette Pop, Lauren Primary Care Anniece Bleiler: [name] Other Clinician: Referring  Alassane Kalafut: Treating Abbigail Anstey/Extender: Thereasa Parkin in Treatment: 4 Active Inactive Pressure Nursing Diagnoses: Knowledge deficit related to management of pressures ulcers Goals: Patient will remain free from development of additional pressure ulcers Date Initiated: 10/29/2021 Target Resolution Date: 01/11/2022 Goal Status: Active Interventions: Assess: immobility, friction, shearing, incontinence upon admission and as needed Assess offloading mechanisms upon admission and as needed Assess potential for pressure ulcer upon admission and as needed Provide education on pressure ulcers Notes: Wound/Skin Impairment Nursing Diagnoses: Impaired tissue integrity Knowledge deficit related to ulceration/compromised skin integrity Goals: Patient/caregiver will verbalize understanding of skin care regimen Date Initiated: 10/29/2021 Target Resolution Date: 01/11/2022 Goal Status: Active Ulcer/skin breakdown will have a volume reduction of 30% by week 4 Date Initiated: 10/29/2021 Target Resolution Date: 01/11/2022 Goal Status: Active Interventions: Assess patient/caregiver ability to obtain necessary supplies SAHITH, NURSE ([id number]) [id number] Page 4 of 7 Assess patient/caregiver ability to perform ulcer/skin care regimen upon admission and as needed Assess ulceration(s) every visit Provide education on ulcer and skin care Notes: Electronic Signature(s) Signed: 11/30/2021 12:09:11 PM By: Rhae Hammock RN Entered By: Rhae Hammock on 11/29/2021 13:56:35 -------------------------------------------------------------------------------- Pain Assessment Details Patient Name: Date of Service: [name], [name] Neither. 11/29/2021 12:45 PM Medical Record Number: [id number] Patient Account Number: [id number] Date of Birth/Sex: Treating RN: 05/02/1995 (26 y.o. Janyth Contes Primary Care Amantha Sklar: [name] Other  Clinician: Referring Karri Kallenbach: Treating Deva Ron/Extender: Thereasa Parkin in Treatment: 4 Active Problems Location of Pain Severity and Description of Pain Patient Has Paino Patient Unable to Respond Site Locations Pain Management and Medication Current Pain Management: Electronic Signature(s) Signed: 11/29/2021 4:44:04 PM By: Adline Peals Entered By: Adline Peals on 11/29/2021 13:15:47 -------------------------------------------------------------------------------- Patient/Caregiver Education Details Patient Name: Date of Service: [name] 11/16/2023andnbsp12:45 PM Medical Record Number: [id number] Patient Account Number: [id number] Date of Birth/Gender: Treating RN: 08-19-1995 (26 y.o. Erie Noe Primary Care Physician: [name] Other Clinician: Referring Physician: Treating Physician/Extender: Thereasa Parkin in Treatment: 4 ORI, TREJOS ([id number]) [id number] Page 5 of 7 Education Assessment Education Provided To: Patient Education Topics Provided Pressure: Methods: Explain/Verbal Responses: State content correctly Wound/Skin Impairment: Methods: Explain/Verbal Responses: State content correctly Electronic Signature(s) Signed: 11/30/2021 12:09:11 PM By: Rhae Hammock RN Entered By: Rhae Hammock on 11/29/2021 14:10:42 -------------------------------------------------------------------------------- Wound Assessment Details Patient Name: Date of Service: [name], [name] Neither. 11/29/2021 12:45 PM Medical Record Number: [id number] Patient Account Number: [id number] Date of Birth/Sex: Treating RN: 1995/08/19 (26 y.o. Janyth Contes Primary Care Nathanael Krist: [name] Other Clinician: Referring Elisse Pennick: Treating Josiel Gahm/Extender: Thereasa Parkin in Treatment: 4 Wound Status  Wound Number: 1 Primary Etiology:  Pressure Ulcer Wound Location: Left Ilium Wound Status: Open Wounding Event: Pressure Injury Comorbid History: Quadriplegia, Seizure Disorder Date Acquired: 10/08/2021 Weeks Of Treatment: 4 Clustered Wound: No Photos Wound Measurements Length: (cm) 4.3 Width: (cm) 3.4 Depth: (cm) 0.7 Area: (cm) 11.483 Volume: (cm) 8.038 % Reduction in Area: 33.5% % Reduction in Volume: -132.6% Epithelialization: None Tunneling: No Undermining: Yes Starting Position (o'clock): 9 Ending Position (o'clock): 3 Maximum Distance: (cm) 1.4 Wound Description Classification: Unstageable/Unclassified JUSTINO, BOZE ([id number]) Wound Margin: Distinct, outline attached Exudate Amount: Medium Exudate Type: Serosanguineous Exudate Color: red, brown Foul Odor After Cleansing: No [id number] Page 6 of 7 Slough/Fibrino Yes Wound Bed Granulation Amount: Medium (34-66%) Exposed Structure Necrotic Amount: Medium (34-66%) Fascia Exposed: No Necrotic Quality: Eschar, Adherent Slough Fat Layer (Subcutaneous Tissue) Exposed: Yes Tendon Exposed: No Muscle Exposed: No Joint Exposed: No Bone Exposed: Yes Periwound Skin Texture Texture Color No Abnormalities Noted: Yes No Abnormalities Noted: Yes Moisture Temperature / Pain No Abnormalities Noted: No Temperature: No Abnormality Maceration: Yes Treatment Notes Wound #1 (Ilium) Wound Laterality: Left Cleanser Wound Cleanser Discharge Instruction: Cleanse the wound with wound cleanser prior to applying a clean dressing using gauze sponges, not tissue or cotton balls. Peri-Wound Care Topical Primary Dressing Dakin's Solution 0.25%, 16 (oz) Discharge Instruction: Moisten gauze with Dakin's solution Secondary Dressing ABD Pad, 5x9 Discharge Instruction: Apply over primary dressing as directed. Woven Gauze Sponge, Non-Sterile 4x4 in Discharge Instruction: Apply over primary dressing as directed. Secured With 5M Medipore H  Soft Cloth Surgical T ape, 4 x 10 (in/yd) Discharge Instruction: Secure with tape as directed. Compression Wrap Compression Stockings Add-Ons Electronic Signature(s) Signed: 11/29/2021 4:44:04 PM By: Adline Peals Entered By: Adline Peals on 11/29/2021 13:31:02 -------------------------------------------------------------------------------- Vitals Details Patient Name: Date of Service: Maxwell [name], [name] 11/29/2021 12:45 PM Medical Record Number: [id number] Patient Account Number: [id number] Date of Birth/Sex: Treating RN: 08-27-1995 (26 y.o. Janyth Contes Primary Care Maryjean Corpening: [name] Other Clinician: Referring Arnecia Ector: Treating Keishawn Darsey/Extender: Thereasa Parkin in Treatment: 4 Vital Signs ANVAY, TENNIS ([id number]) [id number] Page 7 of 7 Time Taken: 13:15 Temperature (F): 98.8 Pulse (bpm): 82 Respiratory Rate (breaths/min): 18 Blood Pressure (mmHg): 122/81 Reference Range: 80 - 120 mg / dl Electronic Signature(s) Signed: 11/29/2021 4:44:04 PM By: Adline Peals Entered By: Adline Peals on 11/29/2021 13:17:03

## 2021-12-04 ENCOUNTER — Telehealth (INDEPENDENT_AMBULATORY_CARE_PROVIDER_SITE_OTHER): Payer: Self-pay | Admitting: Family

## 2021-12-04 DIAGNOSIS — Z79899 Other long term (current) drug therapy: Secondary | ICD-10-CM

## 2021-12-04 NOTE — Telephone Encounter (Signed)
Who's calling (name and relationship to patient) : [name]; mom  Best contact number: [phone]  Provider they see: Cloretta Ned, NP  Reason for call: Mom stated that she wanted to touch base with tina with new developments and she has some questions.   Call ID:      PRESCRIPTION REFILL ONLY  Name of prescription:  Pharmacy:

## 2021-12-04 NOTE — Telephone Encounter (Signed)
I talked with Salvadore Oxford, RD regarding [name]'s large urine output and she recommended decreasing his free water intake to 750-800 ml in a day. I called this to [name] and recommended that she try this for a few days. [name] agreed with this plan. TG

## 2021-12-04 NOTE — Telephone Encounter (Signed)
I spoke with Mom. She said that [name]'s sacral wound has worsened and that he has a bone infection. He is scheduled to see Infectious Disease provider on Weds next week. Mom also reports that she was given some condom catheters when he was discharged from the hospital and that she used one last night because of the way [name] has very large urine output at night that gets into the sacral wound. She felt that the condom catheter was too tight and that it had adhesive on it, which made putting on and removing difficult. I told Mom that I will look into sizing of condom catheters and get back in touch with her. I also talked with her about how to crede the bladder as he tends to hold urine for long periods, then have a very large urinary output. Mom reported that [name] had been sleeping more and wondered if it was Baclofen dose, although he has been tolerating it. She said that the PT that did his wheelchair evaluation recommended increasing the dose but she is reluctant to do so at this time. Finally I talked with her about watching [name] for signs of sepsis and reviewed those with her. I instructed her to call 911 to transport to ED if that occurs. I have a scheduled home visit with [name] on Tuesday next week and will evaluate further at that time.TG

## 2021-12-11 ENCOUNTER — Other Ambulatory Visit: Payer: BC Managed Care – PPO | Admitting: Family

## 2021-12-11 VITALS — HR 130 | Resp 18

## 2021-12-11 DIAGNOSIS — G40109 Localization-related (focal) (partial) symptomatic epilepsy and epileptic syndromes with simple partial seizures, not intractable, without status epilepticus: Secondary | ICD-10-CM | POA: Diagnosis not present

## 2021-12-11 DIAGNOSIS — Q939 Deletion from autosomes, unspecified: Secondary | ICD-10-CM | POA: Diagnosis not present

## 2021-12-11 DIAGNOSIS — Z931 Gastrostomy status: Secondary | ICD-10-CM | POA: Diagnosis not present

## 2021-12-11 DIAGNOSIS — G40219 Localization-related (focal) (partial) symptomatic epilepsy and epileptic syndromes with complex partial seizures, intractable, without status epilepticus: Secondary | ICD-10-CM

## 2021-12-11 DIAGNOSIS — L89156 Pressure-induced deep tissue damage of sacral region: Secondary | ICD-10-CM

## 2021-12-11 DIAGNOSIS — G8250 Quadriplegia, unspecified: Secondary | ICD-10-CM

## 2021-12-11 DIAGNOSIS — F88 Other disorders of psychological development: Secondary | ICD-10-CM

## 2021-12-11 DIAGNOSIS — Q043 Other reduction deformities of brain: Secondary | ICD-10-CM

## 2021-12-11 NOTE — Progress Notes (Unsigned)
[name]   MRN:  [id number]  [birth year]   Provider: Rockwell Germany NP-C Location of Care: [facility]  Visit type:   Last visit:   Referral source: [name], MD History from: Epic chart   Brief history:  Copied from previous record:   Today's concerns:  [name] has been otherwise generally healthy since he was last seen. No health concerns today other than previously mentioned.  Review of systems: Please see HPI for neurologic and other pertinent review of systems. Otherwise all other systems were reviewed and were negative.  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   Social history: Social History   Socioeconomic  History   Marital status: Single    Spouse name: Not on file   Number of children: Not on file   Years of education: Not on file   Highest education level: Not on file  Occupational History   Not on file  Tobacco Use   Smoking status: Never   Smokeless tobacco: Never  Vaping Use   Vaping Use: Never used  Substance and Sexual Activity   Alcohol use: No   Drug use: No   Sexual activity: Never  Other Topics Concern   Not on file  Social History Narrative   Ray [facility].    He does not receive any therapies.     He lives with his parents and siblings.    Social Determinants of Health   Financial Resource Strain: Not on file  Food Insecurity: Not on file  Transportation Needs: Not on file  Physical Activity: Not on file  Stress: Not on file  Social Connections: Not on file  Intimate Partner Violence: Not on file      Past/failed meds: Copied from previous record:  Allergies: Allergies  Allergen Reactions   Cephalosporins Hives    Neck and face   Cefazolin Hives   Cephalexin Hives      Immunizations: Immunization History  Administered Date(s) Administered   DTaP 11/23/1997, 01/20/1998, 04/07/1998, 01/29/1999, 10/26/2002   HIB (PRP-OMP) 11/23/1997, 01/20/1998, 07/13/1998, 01/29/1999   HPV Quadrivalent 02/27/2011, 07/27/2012, 11/27/2012   Hepatitis A 12/24/2005, 10/13/2006   Hepatitis B 04/07/1998, 07/13/1998, 10/19/1998   IPV 11/23/1997, 01/20/1998, 01/29/1999, 10/26/2002  Influenza Nasal 10/04/2008, 01/01/2010, 12/25/2010, 12/18/2011   Influenza, Seasonal, Injecte, Preservative Fre 11/08/2014   Influenza,Quad,Nasal, Live 11/27/2012   Influenza,inj,Quad PF,6+ Mos 10/16/2015, 12/17/2016, 09/08/2017   Influenza,inj,quad, With Preservative 11/25/2013   MMR 10/19/1998, 10/26/2002   Meningococcal Conjugate 03/12/2010, 09/24/2013   Pneumococcal Conjugate-13 10/16/1999   Tdap 10/04/2008   Varicella 10/19/1998,  10/13/2006      Diagnostics/Screenings: Copied from previous record:   Physical Exam: There were no vitals taken for this visit.    Impression: No diagnosis found.    Recommendations for plan of care: The patient's previous Epic records were reviewed. Recent diagnostic studies were reviewed with the patient.  Plan until next visit: Medication -  Reminded -  Call if  No follow-ups on file.  The medication list was reviewed and reconciled. No changes were made in the prescribed medications today. A complete medication list was provided to the patient.  No orders of the defined types were placed in this encounter.    Allergies as of 12/11/2021       Reactions   Cephalosporins Hives   Neck and face   Cefazolin Hives   Cephalexin Hives        Medication List        Accurate as of December 11, 2021 12:11 PM. If you have any questions, ask your nurse or doctor.          acetaminophen 325 MG tablet Commonly known as: TYLENOL Take by mouth.   albuterol (2.5 MG/3ML) 0.083% nebulizer solution Commonly known as: PROVENTIL Inhale into the lungs.   Atrovent HFA 17 MCG/ACT inhaler Generic drug: ipratropium 1 puff every 6 (six) hours.   Baclofen 5 MG Tabs Take 1 tablet by mouth 3 (three) times daily.   cloNIDine 0.1 MG tablet Commonly known as: CATAPRES SMARTSIG:1 Tablet(s) Gastro Tube Twice Daily   ipratropium 0.02 % nebulizer solution Commonly known as: ATROVENT Inhale into the lungs.   ipratropium 0.02 % nebulizer solution Commonly known as: ATROVENT Take 2.5 mLs (0.5 mg total) by nebulization 4 (four) times daily.   lansoprazole 30 MG disintegrating tablet Commonly known as: PREVACID SOLUTAB give FIVE ml's via G-tube ONCE a DAY. shake WELL AND refrigerate.   levETIRAcetam 100 MG/ML solution Commonly known as: KEPPRA TAKE FIFTEEN (15) MILLILITERS BY TUBE TWO TIMES A DAY   Nayzilam 5 MG/0.1ML Soln Generic drug: Midazolam Place 5 mg into the  nose as needed (for seizuers lasting 2 minutes or longer.).   Nutritional Supplement Plus Liqd 4 pouches Liquid Hope Peptide given via gtube daily. 1 pouch @ 540 mL/hr x 4 feeds (7:30 AM, 11:30 AM, 3:30 PM, 7:30 PM)   risperiDONE 0.5 MG tablet Commonly known as: RISPERDAL 0.5 MILLIGRAM FEED-TUBE BID   risperiDONE 0.5 MG tablet Commonly known as: RISPERDAL Take 0.5 mg by mouth 2 (two) times daily.   traMADol 50 MG tablet Commonly known as: ULTRAM Take 50 mg by mouth every 6 (six) hours as needed.   zinc sulfate 220 (50 Zn) MG capsule 220 MILLIGRAM FEED-TUBE DAILY            I discussed this patient's care with the multiple providers involved in his care today to develop this assessment and plan.   Total time spent with the patient was *** minutes, of which 50% or more was spent in counseling and coordination of care.  Rockwell Germany NP-C [facility] [address], [place], [place] [address] Ph. [phone] Fax [phone]

## 2021-12-11 NOTE — Patient Instructions (Addendum)
Thank you for allowing me to see [name] in your home today.   Instructions until your next appointment are as follows: Stop the afternoon dose of Baclofen for now I will call you Thursday afternoon to see how things are going Continue his other medications as prescribed Follow up as scheduled with Dr Coralie Keens  [facility], we are committed to providing exceptional care. You will receive a patient satisfaction survey through text or email regarding your visit today. Your opinion is important to me. Comments are appreciated.   Feel free to contact our office during normal business hours at [phone] with questions or concerns. If there is no answer or the call is outside business hours, please leave a message and our clinic staff will call you back within the next business day.  If you have an urgent concern, please stay on the line for our after-hours answering service and ask for the on-call neurologist.     I also encourage you to use MyChart to communicate with me more directly. If you have not yet signed up for MyChart within [facility], the front desk staff can help you. However, please note that this inbox is NOT monitored on nights or weekends, and response can take up to 2 business days.  Urgent matters should be discussed with the on-call pediatric neurologist.

## 2021-12-12 ENCOUNTER — Other Ambulatory Visit: Payer: Self-pay

## 2021-12-12 ENCOUNTER — Ambulatory Visit (INDEPENDENT_AMBULATORY_CARE_PROVIDER_SITE_OTHER): Payer: BC Managed Care – PPO | Admitting: Internal Medicine

## 2021-12-12 ENCOUNTER — Encounter (INDEPENDENT_AMBULATORY_CARE_PROVIDER_SITE_OTHER): Payer: Self-pay | Admitting: Family

## 2021-12-12 ENCOUNTER — Encounter: Payer: Self-pay | Admitting: Internal Medicine

## 2021-12-12 VITALS — BP 133/86 | HR 109 | Temp 98.5°F | Ht 64.0 in | Wt 100.0 lb

## 2021-12-12 DIAGNOSIS — M4628 Osteomyelitis of vertebra, sacral and sacrococcygeal region: Secondary | ICD-10-CM

## 2021-12-12 DIAGNOSIS — L98429 Non-pressure chronic ulcer of back with unspecified severity: Secondary | ICD-10-CM

## 2021-12-12 DIAGNOSIS — G800 Spastic quadriplegic cerebral palsy: Secondary | ICD-10-CM | POA: Diagnosis not present
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
## 2021-12-12 NOTE — Patient Instructions (Signed)
[name] has biopsy confirmed bone infection At this time it is not urgent we have to give him antibiotics   Please consider these 3 approaches  1) continue what you are doing now, off loading/maximizing nutrition/good wound care, as the wound could heal with this  if wound doesn't appear to make headway in terms of healing, the other 2 options. And both would need good debridement before antibiotics are started 2) treat with antibiotics and continue above measures. Treatment will be 6 weeks. And I can't guarantee the wound will heal  3) similar to option 2, but maximizing the possibility that it will be successful by discussing with general surgery about diverting colostomy and plastic surgery about potential flap coverage of the wound once we start antibiotics treatment    Please let me know once you decide on an option

## 2021-12-13 ENCOUNTER — Telehealth: Payer: Self-pay

## 2021-12-13 ENCOUNTER — Telehealth (INDEPENDENT_AMBULATORY_CARE_PROVIDER_SITE_OTHER): Payer: Self-pay | Admitting: Family

## 2021-12-13 ENCOUNTER — Encounter (HOSPITAL_BASED_OUTPATIENT_CLINIC_OR_DEPARTMENT_OTHER): Payer: BC Managed Care – PPO | Admitting: Internal Medicine

## 2021-12-13 DIAGNOSIS — L89320 Pressure ulcer of left buttock, unstageable: Secondary | ICD-10-CM

## 2021-12-13 DIAGNOSIS — M4628 Osteomyelitis of vertebra, sacral and sacrococcygeal region: Secondary | ICD-10-CM

## 2021-12-13 DIAGNOSIS — G8250 Quadriplegia, unspecified: Secondary | ICD-10-CM

## 2021-12-13 DIAGNOSIS — G809 Cerebral palsy, unspecified: Secondary | ICD-10-CM | POA: Diagnosis not present

## 2021-12-13 MED ORDER — BACLOFEN 5 MG PO TABS: 1.0000 | ORAL_TABLET | Freq: Two times a day (BID) | ORAL | 0 refills | Status: DC

## 2021-12-13 NOTE — Telephone Encounter (Signed)
Patient's mother called to let our office know they will be going forward with option 2 and wants to know how to proceed with the 6 week of antibiotics. Will this be oral or picc line? Please advise
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
## 2021-12-13 NOTE — Telephone Encounter (Signed)
I called [name] to check on [name] since decreasing the Baclofen dose. She said that he had done well with BID dosing and wants to continue that. [name] had questions about the recommendations from infectious disease and we talked about that. She is concerned about [name]'s comfort level while sitting and while being in bed. We talked about potentially stopping the morning dose of Baclofen in a few days and increasing the bedtime dose so that he would get better rest. I asked [name] to call me early next week to report. I also reminded her to call orthopedics for a follow up so that we can be sure that it is ok for [name] to get up in his 4. She agreed with this plan. TG

## 2021-12-14 ENCOUNTER — Other Ambulatory Visit: Payer: Self-pay

## 2021-12-14 ENCOUNTER — Telehealth: Payer: Self-pay | Admitting: Internal Medicine

## 2021-12-14 MED ORDER — CIPROFLOXACIN HCL 750 MG PO TABS: 750.0000 mg | ORAL_TABLET | Freq: Two times a day (BID) | ORAL | 1 refills | Status: DC

## 2021-12-14 MED ORDER — AMOXICILLIN-POT CLAVULANATE 875-125 MG PO TABS: 1.0000 | ORAL_TABLET | Freq: Two times a day (BID) | ORAL | 1 refills | Status: DC

## 2021-12-14 NOTE — Telephone Encounter (Signed)
I spoke to the patient's mother regarding the treatment plan to get verify pharmacy for antibiotics to be sent. Patient's mother had some concerns about the antibiotics and wanted to discuss this with Dr. Gale Journey. Per Dr. Gale Journey he will reach out to the patient today to discuss this with her.  [name]

## 2021-12-14 NOTE — Telephone Encounter (Signed)
Called patient's mother who has questions about antibiotics  I have ordered cipro/amox-clav for the dna analysis from the bone tissue. Please see prior telephone note for detail   She queried: When patient had pna there was some abx adjustment and she wonder if what he was given 2-3 months before would affect our choice   I discussed with her that likely not, but given the current data we have, this is the best educated guess on what might work   Ideally we need bone culture and actual phenotypic antibiotics susceptibility testing to guide our treatment, and what I have at this point is just dna analysis of resistant genes. Explained that not all resistant genes are analyzed      Family will ask wound clinic for bone culture and decide abx, or they can wait on the sample obtainment and start cipro/augmentin then adjust as needed   They'll give [place] an update of what will happen

## 2021-12-14 NOTE — Telephone Encounter (Signed)
Patient's mother returned call and stated she would like both antibiotics sent in to the pharmacy and she had spoke with wound care. I have sent Cipro 750 mg po bid and Augmentin 875 mg bid to patient pharmacy per Dr. Hart Rochester orders.  Lenus Trauger T Brooks Sailors

## 2021-12-14 NOTE — Addendum Note (Signed)
Addended by: Daisy Floro T on: 12/14/2021 12:28 PM   Modules accepted: Orders

## 2021-12-18 ENCOUNTER — Telehealth: Payer: Self-pay | Admitting: Pharmacist

## 2021-12-18 ENCOUNTER — Other Ambulatory Visit: Payer: Self-pay | Admitting: Pharmacist

## 2021-12-18 DIAGNOSIS — M4628 Osteomyelitis of vertebra, sacral and sacrococcygeal region: Secondary | ICD-10-CM

## 2021-12-18 MED ORDER — AMOXICILLIN-POT CLAVULANATE 600-42.9 MG/5ML PO SUSR: 7.5000 mL | Freq: Two times a day (BID) | ORAL | 1 refills | Status: DC

## 2021-12-18 MED ORDER — LEVOFLOXACIN 25 MG/ML PO SOLN: 750.0000 mg | Freq: Every day | ORAL | 1 refills | Status: DC

## 2021-12-18 NOTE — Progress Notes (Signed)
[name] ([id number]) [id number] Page 1 of 8 Visit Report for 12/13/2021 Arrival Information Details Patient Name: Date of Service: [name], [name] 12/13/2021 12:45 PM Medical Record Number: [id number] Patient Account Number: [id number] Date of Birth/Sex: Treating RN: [birth year] (26 y.o. [name] Primary Rivers [name]: [name] Other Clinician: Referring [name]: Treating [name]/Extender: [name] in Treatment: 6 Visit Information History Since Last Visit Added or deleted any medications: No Patient Arrived: Wheel Chair Any new allergies or adverse reactions: No Arrival Time: 13:16 Had a fall or experienced change in No Accompanied By: parents activities of daily living that may affect Transfer Assistance: Manual risk of falls: Patient Identification Verified: Yes Signs or symptoms of abuse/neglect since last visito No Secondary Verification Process Completed: Yes Hospitalized since last visit: No Patient Requires Transmission-Based Precautions: No Implantable device outside of the clinic excluding No Patient Has Alerts: No cellular tissue based products placed in the center since last visit: Has Dressing in Place as Prescribed: Yes Pain Present Now: No Electronic Signature(s) Signed: 12/18/2021 3:48:14 PM By: Maxwell Creamer RN, BSN Entered By: [name] on 12/13/2021 13:17:30 -------------------------------------------------------------------------------- Clinic Level of Rivers Assessment Details Patient Name: Date of Service: [name], [name] 12/13/2021 12:45 PM Medical Record Number: [id number] Patient Account Number: [id number] Date of Birth/Sex: Treating RN: [name] (26 y.o. [name], [name] Dayla Gasca: [name] Other Clinician: Referring [name]: Treating [name]/Extender: [name] in Treatment: 6 Clinic Level of Rivers  Assessment Items TOOL 4 Quantity Score X- 1 0 Use when only an EandM is performed on FOLLOW-UP visit ASSESSMENTS - Nursing Assessment / Reassessment X- 1 10 Reassessment of Co-morbidities (includes updates in patient status) X- 1 5 Reassessment of Adherence to Treatment Plan ASSESSMENTS - Wound and Skin A ssessment / Reassessment X - Simple Wound Assessment / Reassessment - one wound 1 5 '[]'$  - 0 Complex Wound Assessment / Reassessment - multiple wounds '[]'$  - 0 Dermatologic / Skin Assessment (not related to wound area) ASSESSMENTS - Focused Assessment '[]'$  - 0 Circumferential Edema Measurements - multi extremities '[]'$  - 0 Nutritional Assessment / Counseling / Intervention [name] ([id number]) [id number] Page 2 of 8 '[]'$  - 0 Lower Extremity Assessment (monofilament, tuning fork, pulses) '[]'$  - 0 Peripheral Arterial Disease Assessment (using hand held doppler) ASSESSMENTS - Ostomy and/or Continence Assessment and Rivers '[]'$  - 0 Incontinence Assessment and Management '[]'$  - 0 Ostomy Rivers Assessment and Management (repouching, etc.) PROCESS - Coordination of Rivers X - Simple Patient / Family Education for ongoing Rivers 1 15 '[]'$  - 0 Complex (extensive) Patient / Family Education for ongoing Rivers X- 1 10 Staff obtains Programmer, systems, Records, T Results / Process Orders est '[]'$  - 0 Staff telephones HHA, Nursing Homes / Clarify orders / etc '[]'$  - 0 Routine Transfer to another Facility (non-emergent condition) '[]'$  - 0 Routine Hospital Admission (non-emergent condition) '[]'$  - 0 New Admissions / Biomedical engineer / Ordering NPWT Apligraf, etc. , '[]'$  - 0 Emergency Hospital Admission (emergent condition) X- 1 10 Simple Discharge Coordination '[]'$  - 0 Complex (extensive) Discharge Coordination PROCESS - Special Needs '[]'$  - 0 Pediatric / Minor Patient Management '[]'$  - 0 Isolation Patient Management '[]'$  - 0 Hearing / Language / Visual special needs '[]'$  - 0 Assessment  of Community assistance (transportation, D/C planning, etc.) '[]'$  - 0 Additional assistance / Altered mentation '[]'$  - 0 Support Surface(s) Assessment (bed, cushion, seat, etc.) INTERVENTIONS - Wound Cleansing / Measurement X -  Simple Wound Cleansing - one wound 1 5 '[]'$  - 0 Complex Wound Cleansing - multiple wounds X- 1 5 Wound Imaging (photographs - any number of wounds) '[]'$  - 0 Wound Tracing (instead of photographs) X- 1 5 Simple Wound Measurement - one wound '[]'$  - 0 Complex Wound Measurement - multiple wounds INTERVENTIONS - Wound Dressings X - Small Wound Dressing one or multiple wounds 1 10 '[]'$  - 0 Medium Wound Dressing one or multiple wounds '[]'$  - 0 Large Wound Dressing one or multiple wounds X- 1 5 Application of Medications - topical '[]'$  - 0 Application of Medications - injection INTERVENTIONS - Miscellaneous '[]'$  - 0 External ear exam '[]'$  - 0 Specimen Collection (cultures, biopsies, blood, body fluids, etc.) '[]'$  - 0 Specimen(s) / Culture(s) sent or taken to Lab for analysis '[]'$  - 0 Patient Transfer (multiple staff / Civil Service fast streamer / Similar devices) '[]'$  - 0 Simple Staple / Suture removal (25 or less) '[]'$  - 0 Complex Staple / Suture removal (26 or more) '[]'$  - 0 Hypo / Hyperglycemic Management (close monitor of Blood Glucose) [name] ([id number]) [id number] Page 3 of 8 '[]'$  - 0 Ankle / Brachial Index (ABI) - do not check if billed separately X- 1 5 Vital Signs Has the patient been seen at the hospital within the last three years: Yes Total Score: 90 Level Of Rivers: New/Established - Level 3 Electronic Signature(s) Signed: 12/13/2021 5:26:06 PM By: Rhae Hammock RN Entered By: Rhae Hammock on 12/13/2021 13:46:45 -------------------------------------------------------------------------------- Encounter Discharge Information Details Patient Name: Date of Service: [name], Maxwell Hauser G. 12/13/2021 12:45 PM Medical Record Number:  [id number] Patient Account Number: [id number] Date of Birth/Sex: Treating RN: Rivers[birth year] (26 y.o. [name], [name] Brynnley Dayrit: [name] Other Clinician: Referring Zamia Tyminski: Treating Dejon Lukas/Extender: [name] in Treatment: 6 Encounter Discharge Information Items Discharge Condition: Stable Ambulatory Status: Wheelchair Discharge Destination: Home Transportation: Private Auto Accompanied By: mom/dad Schedule Follow-up Appointment: Yes Clinical Summary of Rivers: Patient Declined Electronic Signature(s) Signed: 12/13/2021 5:26:06 PM By: Rhae Hammock RN Entered By: Rhae Hammock on 12/13/2021 13:47:30 -------------------------------------------------------------------------------- Lower Extremity Assessment Details Patient Name: Date of Service: [name], [name] Neither 12/13/2021 12:45 PM Medical Record Number: [id number] Patient Account Number: [id number] Date of Birth/Sex: Treating RN: Rivers[birth year] (26 y.o. [name] Primary Rivers Annye Forrey: [name] Other Clinician: Referring Vaughan Garfinkle: Treating Amberley Hamler/Extender: [name] in Treatment: 6 Electronic Signature(s) Signed: 12/18/2021 3:48:14 PM By: Maxwell Creamer RN, BSN Entered By: [name] on 12/13/2021 13:18:11 -------------------------------------------------------------------------------- Multi Wound Chart Details Patient Name: Date of Service: [name], [place]. 12/13/2021 12:45 PM Medical Record Number: [id number] Patient Account Number: [id number] Maxwell, Rivers ([id number]) [id number] Page 4 of 8 Date of Birth/Sex: Treating RN: [birth year]Rivers (26 y.o. M) Primary Rivers Angelynn Lemus: Other Clinician: Marcha Rivers Referring Darus Hershman: Treating Gladyce Mcray/Extender: [name] in Treatment: 6 Vital Signs Height(in): Pulse(bpm): 114 Weight(lbs): Blood Pressure(mmHg):  125/82 Body Mass Index(BMI): Temperature(F): Respiratory Rate(breaths/min): 18 [1:Photos: No Photos Left Ilium Wound Location: Pressure Injury Wounding Event: Pressure Ulcer Primary Etiology: Quadriplegia, Seizure Disorder Comorbid History: 10/08/2021 Date Acquired: 6 Weeks of Treatment: Open Wound Status: No Wound Recurrence: 2.8x2.5x0.5  Measurements L x W x D (cm) 5.498 A (cm) : rea 2.749 Volume (cm) : 68.20% % Reduction in A rea: 20.50% % Reduction in Volume: 9 Starting Position 1 (o'clock): 3 Ending Position 1 (o'clock): 2.1 Maximum Distance 1 (cm): Yes Undermining:  Unstageable/Unclassified Classification: Medium Exudate A mount: Serosanguineous Exudate  Type: red, brown Exudate Color: Distinct, outline attached Wound Margin: Large (67-100%) Granulation A mount: Small (1-33%) Necrotic A mount: Fat Layer (Subcutaneous  Tissue): Yes N/A Exposed Structures: Bone: Yes Fascia: No Tendon: No Muscle: No Joint: No None Epithelialization: No Abnormalities Noted Periwound Skin Texture: Maceration: Yes Periwound Skin Moisture: No Abnormalities Noted Periwound Skin Color: No  Abnormality Temperature:] [N/A:N/A N/A N/A N/A N/A N/A N/A N/A N/A N/A N/A N/A N/A N/A N/A N/A N/A N/A N/A N/A N/A N/A N/A N/A N/A N/A N/A] Treatment Notes Wound #1 (Ilium) Wound Laterality: Left Cleanser Wound Cleanser Discharge Instruction: Cleanse the wound with wound cleanser prior to applying a clean dressing using gauze sponges, not tissue or cotton balls. Peri-Wound Rivers Topical Primary Dressing Dakin's Solution 0.25%, 16 (oz) Discharge Instruction: Moisten gauze with Dakin's solution Secondary Dressing ABD Pad, 5x9 Discharge Instruction: Apply over primary dressing as directed. Woven Gauze Sponge, Non-Sterile 4x4 in Discharge Instruction: Apply over primary dressing as directed. Secured With 69M Medipore H Soft Cloth Surgical T ape, 4 x 10 (in/yd) Discharge Instruction: Secure with tape as directed. Compression  Maxwell, Rivers ([id number]) [id number] Page 5 of 8 Compression Stockings Add-Ons Electronic Signature(s) Signed: 12/13/2021 4:36:48 PM By: Kalman Shan DO Entered By: Kalman Shan on 12/13/2021 14:31:09 -------------------------------------------------------------------------------- Multi-Disciplinary Rivers Plan Details Patient Name: Date of Service: [name], [name] 12/13/2021 12:45 PM Medical Record Number: [id number] Patient Account Number: [id number] Date of Birth/Sex: Treating RN: [birth year]Rivers (26 y.o. [name], [name] Alyze Lauf: [name] Other Clinician: Referring Cree Napoli: Treating Elzina Devera/Extender: [name] in Treatment: 6 Active Inactive Pressure Nursing Diagnoses: Knowledge deficit related to management of pressures ulcers Goals: Patient will remain free from development of additional pressure ulcers Date Initiated: 10/29/2021 Target Resolution Date: 01/11/2022 Goal Status: Active Interventions: Assess: immobility, friction, shearing, incontinence upon admission and as needed Assess offloading mechanisms upon admission and as needed Assess potential for pressure ulcer upon admission and as needed Provide education on pressure ulcers Notes: Wound/Skin Impairment Nursing Diagnoses: Impaired tissue integrity Knowledge deficit related to ulceration/compromised skin integrity Goals: Patient/caregiver will verbalize understanding of skin Rivers regimen Date Initiated: 10/29/2021 Target Resolution Date: 01/11/2022 Goal Status: Active Ulcer/skin breakdown will have a volume reduction of 30% by week 4 Date Initiated: 10/29/2021 Target Resolution Date: 01/11/2022 Goal Status: Active Interventions: Assess patient/caregiver ability to obtain necessary supplies Assess patient/caregiver ability to perform ulcer/skin Rivers regimen upon admission and as needed Assess ulceration(s)  every visit Provide education on ulcer and skin Rivers Notes: Electronic Signature(s) Signed: 12/13/2021 5:26:06 PM By: Rhae Hammock RN Entered By: Rhae Hammock on 12/13/2021 13:45:45 [name] ([id number]) [id number] Page 6 of 8 -------------------------------------------------------------------------------- Pain Assessment Details Patient Name: Date of Service: [name] 12/13/2021 12:45 PM Medical Record Number: [id number] Patient Account Number: [id number] Date of Birth/Sex: Treating RN: Rivers[birth year] (26 y.o. [name] Primary Rivers Kristinia Leavy: [name] Other Clinician: Referring Kenlee Vogt: Treating Oswell Say/Extender: [name] in Treatment: 6 Active Problems Location of Pain Severity and Description of Pain Patient Has Paino No Site Locations Pain Management and Medication Current Pain Management: Electronic Signature(s) Signed: 12/18/2021 3:48:14 PM By: Maxwell Creamer RN, BSN Entered By: [name] on 12/13/2021 13:18:04 -------------------------------------------------------------------------------- Patient/Caregiver Education Details Patient Name: Date of Service: [name] 11/30/2023andnbsp12:45 PM Medical Record Number: [id number] Patient Account Number: [id number] Date of Birth/Gender: Treating RN: [birth year] (26 y.o. Erie Noe Primary Rivers Physician: [name] Other Clinician: Referring Physician: Treating  Physician/Extender: [name] in Treatment: 6 Education Assessment Education Provided To: Patient Education Topics Provided Pressure: Methods: Explain/Verbal Responses: Reinforcements needed, State content correctly Maxwell, Rivers ([id number]) [id number] Page 7 of 8 Electronic Signature(s) Signed: 12/13/2021 5:26:06 PM By: Rhae Hammock RN Entered By: Rhae Hammock on  12/13/2021 13:46:00 -------------------------------------------------------------------------------- Wound Assessment Details Patient Name: Date of Service: [name], [name] Neither 12/13/2021 12:45 PM Medical Record Number: [id number] Patient Account Number: [id number] Date of Birth/Sex: Treating RN: [birth year]Rivers (26 y.o. [name] Primary Rivers Maayan Jenning: [name] Other Clinician: Referring Ramonte Mena: Treating Janeice Stegall/Extender: [name] in Treatment: 6 Wound Status Wound Number: 1 Primary Etiology: Pressure Ulcer Wound Location: Left Ilium Wound Status: Open Wounding Event: Pressure Injury Comorbid History: Quadriplegia, Seizure Disorder Date Acquired: 10/08/2021 Weeks Of Treatment: 6 Clustered Wound: No Wound Measurements Length: (cm) 2.8 Width: (cm) 2.5 Depth: (cm) 0.5 Area: (cm) 5.498 Volume: (cm) 2.749 % Reduction in Area: 68.2% % Reduction in Volume: 20.5% Epithelialization: None Undermining: Yes Starting Position (o'clock): 9 Ending Position (o'clock): 3 Maximum Distance: (cm) 2.1 Wound Description Classification: Unstageable/Unclassified Wound Margin: Distinct, outline attached Exudate Amount: Medium Exudate Type: Serosanguineous Exudate Color: red, brown Foul Odor After Cleansing: No Slough/Fibrino Yes Wound Bed Granulation Amount: Large (67-100%) Exposed Structure Granulation Quality: Hyper-granulation Fascia Exposed: No Necrotic Amount: Small (1-33%) Fat Layer (Subcutaneous Tissue) Exposed: Yes Necrotic Quality: Adherent Slough Tendon Exposed: No Muscle Exposed: No Joint Exposed: No Bone Exposed: Yes Periwound Skin Texture Texture Color No Abnormalities Noted: Yes No Abnormalities Noted: Yes Moisture Temperature / Pain No Abnormalities Noted: No Temperature: No Abnormality Maceration: Yes Treatment Notes Wound #1 (Ilium) Wound Laterality: Left Cleanser Wound Cleanser Discharge Instruction: Cleanse the  wound with wound cleanser prior to applying a clean dressing using gauze sponges, not tissue or cotton balls. Peri-Wound Rivers Maxwell, Rivers ([id number]) [id number] Page 8 of 8 Topical Primary Dressing Dakin's Solution 0.25%, 16 (oz) Discharge Instruction: Moisten gauze with Dakin's solution Secondary Dressing ABD Pad, 5x9 Discharge Instruction: Apply over primary dressing as directed. Woven Gauze Sponge, Non-Sterile 4x4 in Discharge Instruction: Apply over primary dressing as directed. Secured With 27M Medipore H Soft Cloth Surgical T ape, 4 x 10 (in/yd) Discharge Instruction: Secure with tape as directed. Compression Wrap Compression Stockings Add-Ons Electronic Signature(s) Signed: 12/18/2021 3:48:14 PM By: Maxwell Creamer RN, BSN Entered By: [name] on 12/13/2021 13:21:31 -------------------------------------------------------------------------------- Vitals Details Patient Name: Date of Service: Maxwell [name], Maxwell [name] 12/13/2021 12:45 PM Medical Record Number: [id number] Patient Account Number: [id number] Date of Birth/Sex: Treating RN: [birth year]-Rivers (26 y.o. [name] Primary Rivers Dalesha Stanback: [name] Other Clinician: Referring Khang Hannum: Treating Alek Borges/Extender: [name] in Treatment: 6 Vital Signs Time Taken: 13:10 Pulse (bpm): 114 Respiratory Rate (breaths/min): 18 Blood Pressure (mmHg): 125/82 Reference Range: 80 - 120 mg / dl Electronic Signature(s) Signed: 12/18/2021 3:48:14 PM By: Maxwell Creamer RN, BSN Entered By: [name] on 12/13/2021 13:17:57

## 2021-12-18 NOTE — Telephone Encounter (Signed)
It is - we will try levofloxacin '750mg'$  once daily as liquid formulation and Augmentin suspension. See orders only encounter for prescriptions. Told Selinda Eon to please call us if she has issues picking up the medication. Thanks Dr. Gale Journey! - Estill Bamberg

## 2021-12-18 NOTE — Telephone Encounter (Signed)
Patient's mother, Sharyn Lull, called this morning asking if we could resend Augmentin and ciprofloxacin prescriptions as liquid forms as her son receives nutrition through a G-tube. She states that she has been crushing both Augmentin and ciprofloxacin but that they require a high amount of water volume to administer and flush which is leading [name] to have more frequent diaper incidents around his wound.   I reviewed that an Augmentin solution and ciprofloxacin suspension are available. Not sure how common the ciprofloxacin suspension is. Discussed that these still would be at least 30-50 mL of both medication and flushing with water in between twice daily.   Let me know if you are okay with transitioning to liquid forms of medication. If so, I will resend prescriptions. Otherwise, please advise on next steps.  Alfonse Spruce, PharmD, CPP, BCIDP, [place] Clinical Pharmacist Practitioner Infectious [facility]

## 2021-12-20 ENCOUNTER — Telehealth (INDEPENDENT_AMBULATORY_CARE_PROVIDER_SITE_OTHER): Payer: Self-pay | Admitting: Family

## 2021-12-20 DIAGNOSIS — Z931 Gastrostomy status: Secondary | ICD-10-CM

## 2021-12-20 MED ORDER — FAMOTIDINE 40 MG/5ML PO SUSR: 20.0000 mg | Freq: Two times a day (BID) | ORAL | 1 refills | Status: DC

## 2021-12-20 MED ORDER — ONDANSETRON HCL 4 MG/5ML PO SOLN: ORAL | 1 refills | Status: DC

## 2021-12-20 NOTE — Telephone Encounter (Signed)
  Name of who is calling: Shon Millet Relationship to Patient: mom   Best contact number: [phone]  Provider they see: Rockwell Germany  Reason for call: Mom is asking for a call back. He is not keeping his medicine down that he's taking for wound care.

## 2021-12-20 NOTE — Telephone Encounter (Signed)
Called Mom back. She said that [name] had vomiting on Monday and Tuesday evening, was ok yesterday, then had vomiting suddenly today when she turned him on his side. He has history of reflux and is on Prevacid. He has no rash, fever or diarrhea. I recommended switching to Pepcid and sent in a prescription for that. I also recommended Zofran for nausea and sent in that prescription. I told Mom that some people tolerate the antibiotics better when given as 4 doses per day instead of 2 doses, but that he may still have upset stomach. I recommended spacing the doses as she had been instructed, and giving the Pepcid 20-30 minutes prior to feedings and medications. I will complete a [employer] form and send to his case Freight forwarder. Finally, I instructed Mom to contact infectious disease if he continues to exhibit vomiting. TG

## 2021-12-20 NOTE — Telephone Encounter (Addendum)
Mom states that wound care gave the patient Augmentin and Cipro from wound care. 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 Pt was given this medication on the 3rd of Dec.  He got sick on Monday evening. Tuesday evening and Today.   Brushed his teeth and it didn't go well, mom stated that she thinks the gagging from teeth brushing is what has caused his food to come up.  But this afternoon, she hadnt brushed his teeth.    12/20/2021 7:30 eat 8:30 Augmition  9:45 - Sipro 11:30 - eat  1:30 - got sick    Was in the hospital with pneumonia hospital from vomiting.  Mom thinks its the medication that's making him sick.   SS, CCMA

## 2021-12-21 ENCOUNTER — Telehealth (INDEPENDENT_AMBULATORY_CARE_PROVIDER_SITE_OTHER): Payer: Self-pay

## 2021-12-21 ENCOUNTER — Telehealth (INDEPENDENT_AMBULATORY_CARE_PROVIDER_SITE_OTHER): Payer: Self-pay | Admitting: Family

## 2021-12-21 NOTE — Telephone Encounter (Signed)
Who's calling (name and relationship to patient) : Clent Damore; mom   Best contact number: [phone]  Provider they see: Cloretta Ned, NP  Reason for call: Mom is calling in wanting to speak with Otila Kluver. She has requested a call back.   Call ID:      PRESCRIPTION REFILL ONLY  Name of prescription:  Pharmacy:

## 2021-12-21 NOTE — Telephone Encounter (Signed)
Mom stated that she spoke with Otila Kluver yesterday about her son.   He is now experiencing diarrhea and would like to know how to handle that.  SS, CCMA

## 2021-12-21 NOTE — Telephone Encounter (Signed)
Mom calling to talk about his current wound treatment and tail bone ulcer. He is having some stomach upset from the 2 antibiotics. A few loose stools. No fever.   Requests to talk about further steps (to help/promote harder or more formed stools) as well as keeping the stool away from the current wound being treated.  Please call [place] @ [phone]  B. Roten CMA

## 2021-12-21 NOTE — Telephone Encounter (Signed)
I called and spoke with Mom. I recommended giving him Culturelle BID while he is on the antibiotics, and told her to call the ordering provider if the diarrhea becomes watery or has a very bad odor. I recommended stopping Miralax while he is having loose stools. We talked about the Baclofen and Mom said that she did not give the morning dose this morning and he did well with that. I recommended stopping the morning Baclofen and continuing the bedtime dose for now. Mom agreed with these plans. TG

## 2021-12-21 NOTE — Telephone Encounter (Signed)
See other phone note from today. TG

## 2021-12-25 ENCOUNTER — Ambulatory Visit (INDEPENDENT_AMBULATORY_CARE_PROVIDER_SITE_OTHER): Payer: BC Managed Care – PPO | Admitting: Internal Medicine

## 2021-12-25 ENCOUNTER — Telehealth: Payer: Self-pay | Admitting: Pharmacist

## 2021-12-25 ENCOUNTER — Other Ambulatory Visit: Payer: Self-pay

## 2021-12-25 ENCOUNTER — Telehealth: Payer: Self-pay

## 2021-12-25 DIAGNOSIS — M8660 Other chronic osteomyelitis, unspecified site: Secondary | ICD-10-CM | POA: Diagnosis not present

## 2021-12-25 DIAGNOSIS — M4628 Osteomyelitis of vertebra, sacral and sacrococcygeal region: Secondary | ICD-10-CM

## 2021-12-25 DIAGNOSIS — L98429 Non-pressure chronic ulcer of back with unspecified severity: Secondary | ICD-10-CM | POA: Diagnosis not present

## 2021-12-25 DIAGNOSIS — G800 Spastic quadriplegic cerebral palsy: Secondary | ICD-10-CM | POA: Diagnosis not present

## 2021-12-25 NOTE — Telephone Encounter (Signed)
Patient's mother called with concerns about her son tolerating the Augmentin suspension. He has been vomiting and had diarrhea since starting last week. She has tried a probiotic and Zofran without any relief. Discussed with Dr. Gale Journey and set her up for a telephone virtual visit at 1030am.   Garland Hincapie L. Hasson Gaspard, PharmD, BCIDP, AAHIVP, CPP Clinical Pharmacist Practitioner Infectious Diseases [facility] 12/25/2021, 9:37 AM

## 2021-12-25 NOTE — Telephone Encounter (Signed)
Thank you :)
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
## 2021-12-25 NOTE — Telephone Encounter (Signed)
Per Dr. Gale Journey reached out to IR to schedule appointment for picc line. Patient is scheduled for 12/15 at [facility] at 2. Spoke with Selinda Eon (mother) with appointment information. Requested they arrive 15 mins prior to appointment to check in. Would like to know if IR can take her with patient since he is non verbal. Relayed this to Rivers who said it should be okay. Secure chat sent to [facility] Team to set up first dose of [place]. Leatrice Jewels, RMA

## 2021-12-26 ENCOUNTER — Telehealth (INDEPENDENT_AMBULATORY_CARE_PROVIDER_SITE_OTHER): Payer: Self-pay | Admitting: Family

## 2021-12-26 NOTE — Telephone Encounter (Signed)
I left a message for Mom that I will call her again tomorrow. TG

## 2021-12-26 NOTE — Telephone Encounter (Signed)
Who's calling (name and relationship to patient) : [name]; mom   Best contact number: [phone]  Provider they see: Cloretta Ned, NP  Reason for call: Mom lvm stating that she needs to reschedule appt, but wanted to leave [name] a message. She wanted to let [name] know that [name] is getting a pick line on Friday @ 2 pm for the medicatio that he can't handle orally. And if [name] has any questions that she can be reached at [phone].  Call ID:      PRESCRIPTION REFILL ONLY  Name of prescription:  Pharmacy:

## 2021-12-26 NOTE — Telephone Encounter (Signed)
If she wants to wait until the iv abx thats fine  I have no guarntee though it will be less nausea inducing  No oral abx when iv abx starts  Thanks

## 2021-12-26 NOTE — Telephone Encounter (Signed)
Spoke with Carolynn Sayers with [facility] who stated that patient is set to get first dose of antibiotic at home. Updated Selinda Eon (mother) with this Information.  Has one question for Dr. Gale Journey regarding antibiotics. Would like to know if she needs to give oral antibiotic Friday or if she can d/c since picc line will be placed picc will be placed and will start iv antibiotics Friday evening.  Leatrice Jewels, RMA

## 2021-12-26 NOTE — Telephone Encounter (Signed)
Called patients mother with Dr. Hart [place] response. Did not have any questions at this time. Understands someone from [facility] will call her today to answer questions regarding home health. Leatrice Jewels, RMA

## 2021-12-26 NOTE — Telephone Encounter (Signed)
Called mom back to reschedule appt. She doesn't want to schedule at this time, but would like Otila Kluver to also know that he has not been tolerating his food. She would like to speak to [place].

## 2021-12-27 ENCOUNTER — Telehealth: Payer: Self-pay

## 2021-12-27 ENCOUNTER — Encounter (HOSPITAL_BASED_OUTPATIENT_CLINIC_OR_DEPARTMENT_OTHER): Payer: BC Managed Care – PPO | Attending: Internal Medicine | Admitting: Internal Medicine

## 2021-12-27 DIAGNOSIS — Y92410 Unspecified street and highway as the place of occurrence of the external cause: Secondary | ICD-10-CM | POA: Insufficient documentation

## 2021-12-27 DIAGNOSIS — L89320 Pressure ulcer of left buttock, unstageable: Secondary | ICD-10-CM | POA: Diagnosis not present

## 2021-12-27 DIAGNOSIS — G809 Cerebral palsy, unspecified: Secondary | ICD-10-CM | POA: Insufficient documentation

## 2021-12-27 DIAGNOSIS — M4628 Osteomyelitis of vertebra, sacral and sacrococcygeal region: Secondary | ICD-10-CM | POA: Diagnosis not present
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
## 2021-12-27 NOTE — Progress Notes (Signed)
[name] ([id number]) [id number] Page 1 of 9 Visit Report for 12/27/2021 Arrival Information Details Patient Name: Date of Service: [name], [place] 12/27/2021 12:45 PM Medical Record Number: [id number] Patient Account Number: [id number] Date of Birth/Sex: Treating RN: [birth year] (26 y.o. Burnadette Pop, Lauren Primary Care Ames Hoban: [name] Other Clinician: Referring Rollyn Scialdone: Treating Astria Jordahl/Extender: Thereasa Parkin in Treatment: 8 Visit Information History Since Last Visit Added or deleted any medications: No Patient Arrived: Wheel Chair Any new allergies or adverse reactions: No Arrival Time: 13:08 Had a fall or experienced change in No Accompanied By: mom activities of daily living that may affect Transfer Assistance: Manual risk of falls: Patient Identification Verified: Yes Signs or symptoms of abuse/neglect since last visito No Secondary Verification Process Completed: Yes Hospitalized since last visit: No Patient Requires Transmission-Based Precautions: No Implantable device outside of the clinic excluding No Patient Has Alerts: No cellular tissue based products placed in the center since last visit: Has Dressing in Place as Prescribed: Yes Pain Present Now: No Electronic Signature(s) Signed: 12/27/2021 5:16:09 PM By: Rhae Hammock RN Entered By: Rhae Hammock on 12/27/2021 13:09:55 -------------------------------------------------------------------------------- Clinic Level of Care Assessment Details Patient Name: Date of Service: [name], [place] 12/27/2021 12:45 PM Medical Record Number: [id number] Patient Account Number: [id number] Date of Birth/Sex: Treating RN: 1995-09-15 (26 y.o. Burnadette Pop, Lauren Primary Care Milfred Krammes: [name] Other Clinician: Referring Amalio Loe: Treating Tanvi Gatling/Extender: Thereasa Parkin in Treatment: 8 Clinic Level of Care  Assessment Items TOOL 4 Quantity Score X- 1 0 Use when only an EandM is performed on FOLLOW-UP visit ASSESSMENTS - Nursing Assessment / Reassessment X- 1 10 Reassessment of Co-morbidities (includes updates in patient status) X- 1 5 Reassessment of Adherence to Treatment Plan ASSESSMENTS - Wound and Skin A ssessment / Reassessment '[]'$  - 0 Simple Wound Assessment / Reassessment - one wound '[]'$  - 0 Complex Wound Assessment / Reassessment - multiple wounds '[]'$  - 0 Dermatologic / Skin Assessment (not related to wound area) ASSESSMENTS - Focused Assessment X- 1 5 Circumferential Edema Measurements - multi extremities '[]'$  - 0 Nutritional Assessment / Counseling / Intervention [name] ([id number]) [id number] Page 2 of 9 '[]'$  - 0 Lower Extremity Assessment (monofilament, tuning fork, pulses) '[]'$  - 0 Peripheral Arterial Disease Assessment (using hand held doppler) ASSESSMENTS - Ostomy and/or Continence Assessment and Care '[]'$  - 0 Incontinence Assessment and Management '[]'$  - 0 Ostomy Care Assessment and Management (repouching, etc.) PROCESS - Coordination of Care X - Simple Patient / Family Education for ongoing care 1 15 '[]'$  - 0 Complex (extensive) Patient / Family Education for ongoing care X- 1 10 Staff obtains Programmer, systems, Records, T Results / Process Orders est '[]'$  - 0 Staff telephones HHA, Nursing Homes / Clarify orders / etc '[]'$  - 0 Routine Transfer to another Facility (non-emergent condition) '[]'$  - 0 Routine Hospital Admission (non-emergent condition) '[]'$  - 0 New Admissions / Biomedical engineer / Ordering NPWT Apligraf, etc. , '[]'$  - 0 Emergency Hospital Admission (emergent condition) X- 1 10 Simple Discharge Coordination '[]'$  - 0 Complex (extensive) Discharge Coordination PROCESS - Special Needs '[]'$  - 0 Pediatric / Minor Patient Management '[]'$  - 0 Isolation Patient Management '[]'$  - 0 Hearing / Language / Visual special needs '[]'$  - 0 Assessment  of Community assistance (transportation, D/C planning, etc.) '[]'$  - 0 Additional assistance / Altered mentation '[]'$  - 0 Support Surface(s) Assessment (bed, cushion, seat, etc.) INTERVENTIONS - Wound Cleansing / Measurement X - Simple Wound  Cleansing - one wound 1 5 '[]'$  - 0 Complex Wound Cleansing - multiple wounds X- 1 5 Wound Imaging (photographs - any number of wounds) '[]'$  - 0 Wound Tracing (instead of photographs) X- 1 5 Simple Wound Measurement - one wound '[]'$  - 0 Complex Wound Measurement - multiple wounds INTERVENTIONS - Wound Dressings X - Small Wound Dressing one or multiple wounds 1 10 '[]'$  - 0 Medium Wound Dressing one or multiple wounds '[]'$  - 0 Large Wound Dressing one or multiple wounds X- 1 5 Application of Medications - topical '[]'$  - 0 Application of Medications - injection INTERVENTIONS - Miscellaneous '[]'$  - 0 External ear exam '[]'$  - 0 Specimen Collection (cultures, biopsies, blood, body fluids, etc.) '[]'$  - 0 Specimen(s) / Culture(s) sent or taken to Lab for analysis '[]'$  - 0 Patient Transfer (multiple staff / Civil Service fast streamer / Similar devices) '[]'$  - 0 Simple Staple / Suture removal (25 or less) '[]'$  - 0 Complex Staple / Suture removal (26 or more) '[]'$  - 0 Hypo / Hyperglycemic Management (close monitor of Blood Glucose) [name] ([id number]) [id number] Page 3 of 9 '[]'$  - 0 Ankle / Brachial Index (ABI) - do not check if billed separately X- 1 5 Vital Signs Has the patient been seen at the hospital within the last three years: Yes Total Score: 90 Level Of Care: New/Established - Level 3 Electronic Signature(s) Signed: 12/27/2021 5:16:09 PM By: Rhae Hammock RN Entered By: Rhae Hammock on 12/27/2021 13:33:56 -------------------------------------------------------------------------------- Encounter Discharge Information Details Patient Name: Date of Service: [name], Maxwell Hauser Rivers. 12/27/2021 12:45 PM Medical Record Number:  [id number] Patient Account Number: [id number] Date of Birth/Sex: Treating RN: 12-13-1995 (26 y.o. Burnadette Pop, Lauren Primary Care Psalm Schappell: [name] Other Clinician: Referring Tykiera Raven: Treating Rajon Bisig/Extender: Thereasa Parkin in Treatment: 8 Encounter Discharge Information Items Discharge Condition: Stable Ambulatory Status: Wheelchair Discharge Destination: Home Transportation: Private Auto Accompanied By: mom Schedule Follow-up Appointment: Yes Clinical Summary of Care: Patient Declined Electronic Signature(s) Signed: 12/27/2021 5:16:09 PM By: Rhae Hammock RN Entered By: Rhae Hammock on 12/27/2021 13:34:34 -------------------------------------------------------------------------------- Lower Extremity Assessment Details Patient Name: Date of Service: [name], [name] Neither 12/27/2021 12:45 PM Medical Record Number: [id number] Patient Account Number: [id number] Date of Birth/Sex: Treating RN: [birth year] (26 y.o. Erie Noe Primary Care Melyssa Signor: [name] Other Clinician: Referring Dorrien Grunder: Treating Lauretta Sallas/Extender: Thereasa Parkin in Treatment: 8 Electronic Signature(s) Signed: 12/27/2021 5:16:09 PM By: Rhae Hammock RN Entered By: Rhae Hammock on 12/27/2021 13:10:40 -------------------------------------------------------------------------------- Multi Wound Chart Details Patient Name: Date of Service: Maxwell [name], MA TTHEW Rivers. 12/27/2021 12:45 PM Medical Record Number: [id number] Patient Account Number: [id number] DONTAI, PEMBER ([id number]) [id number] Page 4 of 9 Date of Birth/Sex: Treating RN: May 04, 1995 (26 y.o. M) Primary Care Demario Faniel: Other Clinician: [name] Referring Denna Fryberger: Treating Roczen Waymire/Extender: Thereasa Parkin in Treatment: 8 Vital Signs Height(in): Pulse(bpm): 24 Weight(lbs): Blood Pressure(mmHg):  110/84 Body Mass Index(BMI): Temperature(F): 98.7 Respiratory Rate(breaths/min): 17 [1:Photos:] [N/A:N/A] Left Ilium N/A N/A Wound Location: Pressure Injury N/A N/A Wounding Event: Pressure Ulcer N/A N/A Primary Etiology: Quadriplegia, Seizure Disorder N/A N/A Comorbid History: 10/08/2021 N/A N/A Date Acquired: 8 N/A N/A Weeks of Treatment: Open N/A N/A Wound Status: No N/A N/A Wound Recurrence: 2.6x1.5x1 N/A N/A Measurements L x W x D (cm) 3.063 N/A N/A A (cm) : rea 3.063 N/A N/A Volume (cm) : 82.30% N/A N/A % Reduction in A rea: 11.40% N/A N/A % Reduction in Volume: 9 Starting Position 1 (  o'clock): 5 Ending Position 1 (o'clock): 1.5 Maximum Distance 1 (cm): Yes N/A N/A Undermining: Unstageable/Unclassified N/A N/A Classification: Medium N/A N/A Exudate A mount: Serosanguineous N/A N/A Exudate Type: red, brown N/A N/A Exudate Color: Distinct, outline attached N/A N/A Wound Margin: Large (67-100%) N/A N/A Granulation A mount: Small (1-33%) N/A N/A Necrotic A mount: Fat Layer (Subcutaneous Tissue): Yes N/A N/A Exposed Structures: Bone: Yes Fascia: No Tendon: No Muscle: No Joint: No None N/A N/A Epithelialization: No Abnormalities Noted N/A N/A Periwound Skin Texture: Maceration: No N/A N/A Periwound Skin Moisture: No Abnormalities Noted N/A N/A Periwound Skin Color: No Abnormality N/A N/A Temperature: Treatment Notes Wound #1 (Ilium) Wound Laterality: Left Cleanser Wound Cleanser Discharge Instruction: Cleanse the wound with wound cleanser prior to applying a clean dressing using gauze sponges, not tissue or cotton balls. Peri-Wound Care Skin Prep Discharge Instruction: Use skin prep as directed Topical Primary Dressing Dakin's Solution 0.25%, 16 (oz) Discharge Instruction: Moisten gauze with Dakin's solution Secondary Dressing FERDIE, BAKKEN ([id number]) [phone].pdf Page 5 of 9 Woven Gauze Sponge,  Non-Sterile 4x4 in Discharge Instruction: Apply over primary dressing as directed. Zetuvit Plus Silicone Border Sacrum Dressing, Sm, 7x7 (in/in) Discharge Instruction: Apply silicone border over primary dressing as directed. Secured With 54M Medipore H Soft Cloth Surgical T ape, 4 x 10 (in/yd) Discharge Instruction: Secure with tape as directed. Compression Wrap Compression Stockings Add-Ons Electronic Signature(s) Signed: 12/27/2021 5:16:42 PM By: Kalman Shan DO Entered By: Kalman Shan on 12/27/2021 13:42:03 -------------------------------------------------------------------------------- Multi-Disciplinary Care Plan Details Patient Name: Date of Service: [name], [name] Neither 12/27/2021 12:45 PM Medical Record Number: [id number] Patient Account Number: [id number] Date of Birth/Sex: Treating RN: [birth year] (26 y.o. Burnadette Pop, Lauren Primary Care Phillp Dolores: [name] Other Clinician: Referring Jasper Hanf: Treating Elnora Quizon/Extender: Thereasa Parkin in Treatment: 8 Active Inactive Pressure Nursing Diagnoses: Knowledge deficit related to management of pressures ulcers Goals: Patient will remain free from development of additional pressure ulcers Date Initiated: 10/29/2021 Target Resolution Date: 01/11/2022 Goal Status: Active Interventions: Assess: immobility, friction, shearing, incontinence upon admission and as needed Assess offloading mechanisms upon admission and as needed Assess potential for pressure ulcer upon admission and as needed Provide education on pressure ulcers Notes: Wound/Skin Impairment Nursing Diagnoses: Impaired tissue integrity Knowledge deficit related to ulceration/compromised skin integrity Goals: Patient/caregiver will verbalize understanding of skin care regimen Date Initiated: 10/29/2021 Target Resolution Date: 01/11/2022 Goal Status: Active Ulcer/skin breakdown will have a volume reduction of 30% by week  4 Date Initiated: 10/29/2021 Target Resolution Date: 01/11/2022 Goal Status: Active Interventions: Assess patient/caregiver ability to obtain necessary supplies Assess patient/caregiver ability to perform ulcer/skin care regimen upon admission and as needed KASTON, FAUGHN ([id number]) [id number] Page 6 of 9 Assess ulceration(s) every visit Provide education on ulcer and skin care Notes: Electronic Signature(s) Signed: 12/27/2021 5:16:09 PM By: Rhae Hammock RN Entered By: Rhae Hammock on 12/27/2021 11:25:49 -------------------------------------------------------------------------------- Pain Assessment Details Patient Name: Date of Service: [name], [name] Neither. 12/27/2021 12:45 PM Medical Record Number: [id number] Patient Account Number: [id number] Date of Birth/Sex: Treating RN: 02/18/1995 (26 y.o. Burnadette Pop, Lauren Primary Care Moroni Nester: [name] Other Clinician: Referring Kamiya Acord: Treating Keishla Oyer/Extender: Thereasa Parkin in Treatment: 8 Active Problems Location of Pain Severity and Description of Pain Patient Has Paino No Site Locations Pain Management and Medication Current Pain Management: Electronic Signature(s) Signed: 12/27/2021 5:16:09 PM By: Rhae Hammock RN Entered By: Rhae Hammock on 12/27/2021 13:10:21 -------------------------------------------------------------------------------- Patient/Caregiver Education Details Patient Name: Date of Service: FO  X, [place] 12/14/2023andnbsp12:45 PM Medical Record Number: [id number] Patient Account Number: [id number] Date of Birth/Gender: Treating RN: December 06, 1995 (26 y.o. Erie Noe Primary Care Physician: [name] Other Clinician: Referring Physician: Treating Physician/Extender: Thereasa Parkin in Treatment: [place], Gerrit Heck ([id number]) [id number] Page 7 of  9 Education Assessment Education Provided To: Patient Education Topics Provided Pressure: Methods: Explain/Verbal Responses: Reinforcements needed, State content correctly Wound/Skin Impairment: Methods: Explain/Verbal Responses: Reinforcements needed, State content correctly Electronic Signature(s) Signed: 12/27/2021 5:16:09 PM By: Rhae Hammock RN Entered By: Rhae Hammock on 12/27/2021 11:26:12 -------------------------------------------------------------------------------- Wound Assessment Details Patient Name: Date of Service: [name], [name] Neither. 12/27/2021 12:45 PM Medical Record Number: [id number] Patient Account Number: [id number] Date of Birth/Sex: Treating RN: [birth year] (26 y.o. M) Primary Care Valene Villa: [name] Other Clinician: Referring Ammara Raj: Treating Kymoni Lesperance/Extender: Thereasa Parkin in Treatment: 8 Wound Status Wound Number: 1 Primary Etiology: Pressure Ulcer Wound Location: Left Ilium Wound Status: Open Wounding Event: Pressure Injury Comorbid History: Quadriplegia, Seizure Disorder Date Acquired: 10/08/2021 Weeks Of Treatment: 8 Clustered Wound: No Photos Wound Measurements Length: (cm) 2.6 Width: (cm) 1.5 Depth: (cm) 1 Area: (cm) 3.063 Volume: (cm) 3.063 % Reduction in Area: 82.3% % Reduction in Volume: 11.4% Epithelialization: None Tunneling: No Undermining: Yes Starting Position (o'clock): 9 Ending Position (o'clock): 5 Maximum Distance: (cm) 1.5 Wound Description Classification: Unstageable/Unclassified Wound Margin: Distinct, outline attached Profitt, Johanna Rivers ([id number]) Exudate Amount: Medium Exudate Type: Serosanguineous Exudate Color: red, brown Foul Odor After Cleansing: No Slough/Fibrino Yes [phone].pdf Page 8 of 9 Wound Bed Granulation Amount: Large (67-100%) Exposed Structure Granulation Quality: Hyper-granulation Fascia Exposed: No Necrotic Amount: Small  (1-33%) Fat Layer (Subcutaneous Tissue) Exposed: Yes Necrotic Quality: Adherent Slough Tendon Exposed: No Muscle Exposed: No Joint Exposed: No Bone Exposed: Yes Periwound Skin Texture Texture Color No Abnormalities Noted: Yes No Abnormalities Noted: Yes Moisture Temperature / Pain No Abnormalities Noted: No Temperature: No Abnormality Maceration: No Treatment Notes Wound #1 (Ilium) Wound Laterality: Left Cleanser Wound Cleanser Discharge Instruction: Cleanse the wound with wound cleanser prior to applying a clean dressing using gauze sponges, not tissue or cotton balls. Peri-Wound Care Skin Prep Discharge Instruction: Use skin prep as directed Topical Primary Dressing Dakin's Solution 0.25%, 16 (oz) Discharge Instruction: Moisten gauze with Dakin's solution Secondary Dressing Woven Gauze Sponge, Non-Sterile 4x4 in Discharge Instruction: Apply over primary dressing as directed. Zetuvit Plus Silicone Border Sacrum Dressing, Sm, 7x7 (in/in) Discharge Instruction: Apply silicone border over primary dressing as directed. Secured With 40M Medipore H Soft Cloth Surgical T ape, 4 x 10 (in/yd) Discharge Instruction: Secure with tape as directed. Compression Wrap Compression Stockings Add-Ons Electronic Signature(s) Signed: 12/27/2021 4:17:22 PM By: Erenest Blank Entered By: Erenest Blank on 12/27/2021 13:24:00 -------------------------------------------------------------------------------- Vitals Details Patient Name: Date of Service: Maxwell [name], MA TTHEW Rivers. 12/27/2021 12:45 PM Medical Record Number: [id number] Patient Account Number: [id number] Date of Birth/Sex: Treating RN: [birth year] (26 y.o. Burnadette Pop, Lauren Primary Care Sharonann Malbrough: [name] Other Clinician: Referring Lior Cartelli: Treating Clara Herbison/Extender: Thereasa Parkin in Treatment: [address], Gerrit Heck ([id number]) [id number] Page 9 of 9 Vital Signs Time Taken:  13:09 Temperature (F): 98.7 Pulse (bpm): 74 Respiratory Rate (breaths/min): 17 Blood Pressure (mmHg): 110/84 Reference Range: 80 - 120 mg / dl Electronic Signature(s) Signed: 12/27/2021 5:16:09 PM By: Rhae Hammock RN Entered By: Rhae Hammock on 12/27/2021 13:10:14

## 2021-12-27 NOTE — Telephone Encounter (Signed)
[name] with [facility] called office stating she spoke with patient's mother who states that they were going to cancel picc line appointment after speaking with [facility], Np with with Pedo Neuro.  Attempted to call Sharyn Lull (mother) to follow up. Not able to reach her at this time. Left voicemail requesting call back.  Leatrice Jewels, RMA

## 2021-12-27 NOTE — Telephone Encounter (Signed)
Error, Leatrice Jewels, RMA

## 2021-12-27 NOTE — Telephone Encounter (Signed)
I called and spoke with Mom. She said that [name] has been scheduled for a PICC line tomorrow to receive IV antibiotics but that he is doing better with tolerating feedings and medication since Zofran was added. Dr Gale Journey with infectious disease recommended increased dose of 8-10 ml of Zofran and Mom has found that 49m twice per day has helped him to tolerate the antibiotic. She plans to cancel the PICC line and continue with the antibiotic given by tube for now. I agreed with her plan and invited her to call back if she has questions or concerns. TG

## 2021-12-27 NOTE — Telephone Encounter (Signed)
Patient's mother called office back stating that she would like to hold off on picc line and continue with oral antibiotics. States after speaking with NP and increasing zofran she has noticed no issues with nausea.  Will cancel PICC appointment. Advised she call with any questions/ concerns. Leatrice Jewels, RMA

## 2021-12-27 NOTE — Telephone Encounter (Signed)
Opened in error

## 2021-12-28 ENCOUNTER — Ambulatory Visit (HOSPITAL_COMMUNITY): Payer: BC Managed Care – PPO

## 2021-12-28 ENCOUNTER — Ambulatory Visit (INDEPENDENT_AMBULATORY_CARE_PROVIDER_SITE_OTHER): Payer: BC Managed Care – PPO | Admitting: Pediatrics

## 2022-01-03 NOTE — Progress Notes (Signed)
This is a Pediatric Specialist E-Visit follow up consult provided via [place] Visit. Conley Canal and their parent/guardian consented to an E-Visit consult today.  Location of patient: [place] is at home.  Location of provider: Carney Bern, RD is at [facility]).  This visit was done via [place] - Progress Note Appt start time: 9:25 AM  Appt end time: 10:05 AM  Reason for referral: Gtube dependence Referring provider: Dr. Coralie Keens - Neuro Pertinent medical hx: Spastic Quadriparesis, Intellectual Disability, Laryngopharyngeal reflux, Epilepsy, Cerebral Palsy, Chromosomal deletion syndrome, dysphagia, +Gtube  Assessment: Food allergies: none noted in Epic  Pertinent Medications: see medication list - Keppra, Prevacid Vitamins/Supplements: vitamin C (unsure of amount), zinc (unsure of amount), vitamin D3 (unsure of amount) Pertinent labs:  (11/2) CBC: Hemoglobin - 12.8 (low)  No anthropometrics taken on 1/4 due to virtual visit. Most recent anthropometrics 11/29 were used to determine dietary needs.   (11/29) Anthropometrics: Ht: 162.5 cm   Wt: 45.4 kg   BMI: 17.1    07/11/21 Wt: 45.4 kg; BMI: 17.1 05/31/21 Wt: 45.4 kg; BMI: 17.1 01/25/21 Wt: 45.4 kg; BMI: 17.1  12/21/20 Wt: 45.4 kg; BMI: 17.1  Estimated minimum caloric needs: 2000 kcal/day (wt loss with current regimen)  Estimated minimum protein needs: 1.5-2.0 g/day (DRI and clinical judgement) Estimated minimum fluid needs: 1800-2000 mL/day (1 mL/kcal)   Primary concerns today: Follow-up for gtube dependence. Mom accompanied pt to appt today.   Dietary Intake Hx: DME: [employer]  Formula: Liquid Hope Peptide Current regimen:  Day feeds: 12 oz (1 pouch) @ 360 mL/hr x 4 feeds (8 AM, 12 PM, 4 PM 8 PM) Overnight feeds: none Total Volume: ~4 pouches total   FWF: 50 mL after each feed (x4); 150 mL bolus (1:30, 5:30); 50 mL with meds x4 - 800-850 mL   Nutritional  Supplements: 2 scoops beneprotein (1/2 scoop at each feed), 1 pack Juven (juven added ~1:30 PM water flush)   Position during feeds: upright in chair (stays upright for at least a few hours after feed to aid in reflux) PO foods/beverages: tastes occasionally  Notes: Mom reports Oakes got a PICC line on December 28th and has since been much better. Mom denies any feeding intolerances since PICC line and mom has been able to slowly increase up to 4 bags of formula daily. Mom has been putting necessary water needed into a pitcher and makes sure Marcus receives at least 800 mL of free water daily to ensure hydration status.   GI: severe constipation since accident (small amount a few times per day), previously daily - Miralax (unable to go without Miralax), laxative PRN  GU: 4-5+/day (very saturated)   Physical Activity: wheelchair bound  Estimated caloric intake: 2000 kcal/day - meets 100% of estimated needs Estimated protein intake: 2.2 g/kg/day - meets 110-147% of estimated needs Estimated fluid intake: 1810 mL/day - meets 100-91% of estimated needs  Micronutrient Intake  Vitamin A 1036 mcg  Vitamin C 612 mg  Vitamin D 20 mcg  Vitamin E 16.1 mg  Vitamin K 167.2 mcg  Vitamin B1 (thiamin) 1.6 mg  Vitamin B2 (riboflavin) 1.7 mg  Vitamin B3 (niacin) 25.4 mg  Vitamin B5 (pantothenic acid) 6.6 mg  Vitamin B6 2.3 mg  Vitamin B7 (biotin) 260 mcg  Vitamin B9 (folate) 444 mcg  Vitamin B12 6 mcg  Choline 589.2 mg  Calcium 1400 mg  Chromium 40 mcg  Copper 1440 mcg  Fluoride 0  mg  Iodine 208 mcg  Iron 28.8 mg  Magnesium 629.6 mg  Manganese 4.8 mg  Molybdenum 0 mcg  Phosphorous 905.6 mg  Selenium 69.6 mcg  Zinc 16 mg  Potassium 4240 mg  Sodium 2160 mg  Chloride 0 mg  Fiber 44 g   Nutrition Diagnosis: (1/12) Inadequate oral intake related to medical condition as evidenced by pt dependent on Gtube feedings to meet nutritional needs.  Intervention: Discussed pt's current regimen.  Discussed need for updated weight to be able to determine adequate intake. Mom plans to bring Legrand in for a weight check in the next few weeks to be able to see weight trends by next appointment in April. Discussed recommendations below. All questions answered, family in agreement with plan.   Nutrition Recommendations sent to [email or web address]:  - Continue current regimen of 4 pouches per day.  - Just add in 1 more 100-150 mL water bolus throughout the day.  - Our goal rate for 45 minutes would be about 435-445 mL/hr. You are welcome to increase to this as tolerated, increasing 2-3 mL/hr per day.   Teach back method used.  Monitoring/Evaluation: Continue to Monitor: - Weight trends  - TF tolerance   Follow-up scheduled for April 18th.  Total time spent in counseling: 45 minutes.

## 2022-01-04 ENCOUNTER — Other Ambulatory Visit (INDEPENDENT_AMBULATORY_CARE_PROVIDER_SITE_OTHER): Payer: Self-pay | Admitting: Family

## 2022-01-04 DIAGNOSIS — Z931 Gastrostomy status: Secondary | ICD-10-CM

## 2022-01-04 DIAGNOSIS — R112 Nausea with vomiting, unspecified: Secondary | ICD-10-CM

## 2022-01-04 MED ORDER — ONDANSETRON HCL 4 MG/5ML PO SOLN: ORAL | 3 refills | Status: DC

## 2022-01-04 NOTE — Telephone Encounter (Signed)
Contacted pt's mother. Verified pt's name and DOB as well as mother name.  Mom would like to talk directly to [place] or [place].    Dr. Alen Bleacher (infectious disease provider) told mom that pt could have 8-10 ML of Zofran.  Mom wants to increase Zofran because the patient is continuing to throw up.   Nutrition - Mom would like to know if she should be feeding him smaller meals more frequently? Patient has loss a lot of weight and is atrophied.   Mom is scared she will be left alone with no help throughout the holidays with a sick child.   SS, CCMA

## 2022-01-04 NOTE — Telephone Encounter (Signed)
  Name of who is calling: Kizzie Furnish Relationship to Patient: mom   Best contact number: [phone]  Provider they see: [facility] [place]  Reason for call: mom is calling stating pt is vomiting. She is asking for a refill on higher dose of Zofran. She thinks its because the higher dose of antibiotics. [facility] on [address] is her preferred pharmacy. She asked for you to call her to go over everything.

## 2022-01-04 NOTE — Telephone Encounter (Signed)
I attempted to call Mom x 3 but the phone cut off each time. I sent her an email and will await her response. TG

## 2022-01-04 NOTE — Telephone Encounter (Signed)
Please let Mom know that I will call her but it will be later this afternoon. Thanks, Otila Kluver

## 2022-01-04 NOTE — Telephone Encounter (Signed)
RD spoke with mom who noted concern for vomiting which she thinks is due to antibiotic Rodman Key will be on antibiotic for the next month). Mom is wondering if it would be beneficial to do smaller, more frequent feeds. Confusion around exact amount Erling is receiving, however mom suspects about 3 pouches per day.   Plummer's current regimen is: Formula: Liquid Hope Peptide  Day Feeds: ~280 mL @ 540 mL/hr x 4 feeds (8:30 AM, 12:30 PM, 4:30 PM, 8:30 PM)   RD and mom discussed options of decreasing rate, spacing out feeds longer, smaller, more frequent feeds. Plan decided was:   Step 1: reduce rate for feeds to finish over 1 hour and start feeds a little later in the day (8:30 AM rather than 8 AM) 270 mL @ 270 mL/hr x 4 feeds (8:30 AM, 12:30 PM, 4:30 PM, 8:30 PM)   *should Step 1 not help with vomiting, try Step 2* Step 2: space feeds further apart   270 mL @ 270 mL/hr x 4 feeds (8:30 AM, 1 PM, 5:30 PM, 10 PM)   *should Step 2 not help with vomiting, try Step 3:  Step 3: smaller, more frequent feeds over an hour   215 mL @ 215 mL/hr x 5 feeds (8 AM, 11:30 AM, 3 PM, 6:30 PM, 10 PM)

## 2022-01-05 MED ORDER — ONDANSETRON HCL 4 MG/5ML PO SOLN: ORAL | 3 refills | Status: DC

## 2022-01-05 NOTE — Telephone Encounter (Signed)
Late entry from yesterday evening. I was able to speak to Mom. She reports that Abdou has been vomiting every morning after the first feeding. She is going to start giving Prevacid in the morning before feeding and will start the feeding plan that [place] outlined. She has been giving 7-88m of Ondansetron when vomiting occurs. I agreed with the plans made and will update the Ondansetron prescription. TG

## 2022-01-05 NOTE — Addendum Note (Signed)
Addended by: Joelyn Oms on: 01/05/2022 07:03 AM   Modules accepted: Orders

## 2022-01-09 ENCOUNTER — Telehealth (INDEPENDENT_AMBULATORY_CARE_PROVIDER_SITE_OTHER): Payer: Self-pay | Admitting: Family

## 2022-01-09 DIAGNOSIS — G800 Spastic quadriplegic cerebral palsy: Secondary | ICD-10-CM

## 2022-01-09 DIAGNOSIS — L89156 Pressure-induced deep tissue damage of sacral region: Secondary | ICD-10-CM

## 2022-01-09 DIAGNOSIS — Q043 Other reduction deformities of brain: Secondary | ICD-10-CM

## 2022-01-09 DIAGNOSIS — F88 Other disorders of psychological development: Secondary | ICD-10-CM

## 2022-01-09 DIAGNOSIS — Q939 Deletion from autosomes, unspecified: Secondary | ICD-10-CM

## 2022-01-09 DIAGNOSIS — Z931 Gastrostomy status: Secondary | ICD-10-CM

## 2022-01-09 DIAGNOSIS — G40309 Generalized idiopathic epilepsy and epileptic syndromes, not intractable, without status epilepticus: Secondary | ICD-10-CM

## 2022-01-09 NOTE — Telephone Encounter (Signed)
  Name of who is calling: Alvino Chapel  Caller's Relationship to Patient: mom  Best contact number: [phone]  Provider they see:   Reason for call: Left message for [name] earlier. Mom has additional question about the new medication he will be put on for his picline, Piptazo.  Concerned about getting some additional in home health care support for [name] when her husband is out of town for additional support for his picline for the beginning. Mom wants support to handle the giving medication through th picline.     PRESCRIPTION REFILL ONLY  Name of prescription:  Pharmacy:

## 2022-01-09 NOTE — Telephone Encounter (Signed)
  Name of who is calling: Shon Millet Relationship to Patient: mom   Best contact number: [phone]  Provider they see: Rockwell [place]  Reason for call: mom is calling stating [name] is getting his [facility] line tomorrow afternoon.

## 2022-01-09 NOTE — Telephone Encounter (Signed)
Returned mother's call.  Confirmed piptazo will not interact with his medications.  Advised that based on documentation from the ID clinic, a home health nurse has been ordered. Mother says she has spoken with [facility] and they will only come to give the first dose, then come out once weekly and that is not enough.  ID clinic has told her that any further care would need to be ordered by PCP, but mother feels Otila Kluver knows his case better.   After further discussion, it sounds like she is interested in in nurse aid/personal care services.  Reviewed other treatments he receives, He has a gtube, no respiratory interventions, no seizures over the last few month.  I told mom I'm not sure if he qualifies, but we can try to place referral.  Also advised this may not occur quickly. She says father is available until 1/7 and then going out of town, that's when it would be possibly needed.   In addition, mother worried about reaction to first dose. He previously had an allergic response to other antibiotics.  I advised he may be able to get first dose in hospital when he gets his PICC.  Recommend talking to ID clinic tomorrow, but I will also talk to [facility] tomorrow morning and see what we can do.     Total time 15 minutes.  Of note, number in phone note incorrect.  Used number from demographics.   Carylon Perches MD MPH

## 2022-01-09 NOTE — Telephone Encounter (Signed)
I contacted mom to let her know that we have received her messages.  Mom stated that Dr. Rogers Blocker contacted her previously and left a messaging stating to call if she has any questions or concerns.  I informed mom that I would send this message to provider as well.   SS, CCMA

## 2022-01-09 NOTE — Telephone Encounter (Signed)
Patient mother called stating that she would like to proceed with PICC line placement. Patient hasn't been tolerating antibiotics since over the weekend.

## 2022-01-09 NOTE — Telephone Encounter (Signed)
New OPAT orders per Dr. Gale Journey, orders shared with Carolynn Sayers, RN at [facility] and [place] staff.   IR appointment: 01/10/2022 at [facility] IR at 11 AM - appointment time and location provided to both patient and [facility].   First dose: will need short stay - have contacted Carolynn Sayers, RN to see if patient can receive in home.

## 2022-01-09 NOTE — Addendum Note (Signed)
Addended by: Prudencio Pair T on: 01/09/2022 10:08 AM   Modules accepted: Orders

## 2022-01-09 NOTE — Telephone Encounter (Signed)
I returned call, no answer.  Left confidential voicemail that Otila Kluver was out of the office but I would let her know about PICC line.  Asked mother to please call back if she had any other questions or concerns.    Carylon Perches MD MPH

## 2022-01-09 NOTE — Telephone Encounter (Signed)
Diagnosis: Chronic sacral osteomyelitis  Patient/family wants treatment after discussion likely will be of no benefit due to high risk recurrence and no option for debridement/diverting colostomy   Allergies  Allergen Reactions   Cephalosporins Hives    Neck and face   Cefazolin Hives   Cephalexin Hives    OPAT Orders Discharge antibiotics to be given via PICC line Discharge antibiotics: Piperacillin-tazobactam 13.5 gram continuous daily infusion  Duration: 6 weeks  End Date: 02/21/2022  [facility] Care Per Protocol:  Home health RN for IV administration and teaching; PICC line care and labs.    Labs weekly while on IV antibiotics: _x_ CBC with differential __ BMP _x_ CMP _x_ CRP __ ESR __ Vancomycin trough __ CK  _x_ Please pull PIC at completion of IV antibiotics __ Please leave PIC in place until doctor has seen patient or been notified  Fax weekly labs to [phone]  Clinic Follow Up Appt: 4 weeks  @  [facility] [place], [place],  [address] Phone: [phone]

## 2022-01-10 ENCOUNTER — Telehealth: Payer: Self-pay | Admitting: Pharmacist

## 2022-01-10 ENCOUNTER — Other Ambulatory Visit: Payer: Self-pay | Admitting: Internal Medicine

## 2022-01-10 ENCOUNTER — Ambulatory Visit (HOSPITAL_COMMUNITY)
Admission: RE | Admit: 2022-01-10 | Discharge: 2022-01-10 | Disposition: A | Discharge status: Home / Self Care | Payer: BC Managed Care – PPO | Source: Ambulatory Visit | Attending: Internal Medicine | Admitting: Internal Medicine

## 2022-01-10 DIAGNOSIS — M4628 Osteomyelitis of vertebra, sacral and sacrococcygeal region: Secondary | ICD-10-CM | POA: Insufficient documentation

## 2022-01-10 MED ORDER — LIDOCAINE HCL 1 % IJ SOLN
INTRAMUSCULAR | Status: AC
  Administered 2022-01-10: 10 mL
  Filled 2022-01-10: qty 20

## 2022-01-10 MED ORDER — HEPARIN SOD (PORK) LOCK FLUSH 100 UNIT/ML IV SOLN
INTRAVENOUS | Status: AC
  Filled 2022-01-10: qty 5

## 2022-01-10 NOTE — Addendum Note (Signed)
Addended by: Joelyn Oms on: 01/10/2022 09:32 AM   Modules accepted: Orders

## 2022-01-10 NOTE — Telephone Encounter (Signed)
Spoke with Selinda Eon, patient's mother, this morning regarding the new antibiotic he will be starting on - Zosyn continuous infusion.  She wanted to know most common side effects and things to look out for. Counseled that the most common is diarrhea and infusion reactions. She is concerned as he has had antibiotic reactions before (hives with cephalosporins). I advised that Zosyn is a little like cephalosporins but that he has been taking Augmentin for the last several weeks with no issues and that Zosyn was very much like Augmentin. I told her that I did not think he should have a reaction if he tolerates the Augmentin without hives but that it is not 100% known.   He is set up for the first dose in the home and she was going back and forth whether she would like his first dose to be in the hospital instead of the home. After much discussion, she decided that the risk of him being exposed to something while here (flu, COVID, other viruses) outweighs the potential benefit of him getting the first dose at short stay. She feels comfortable with the nurse coming in to do it in their home. Asked her to reach back out if she has any further questions.   Cherice Glennie L. Kenzlei Runions, PharmD, BCIDP, [place], CPP Clinical Pharmacist Practitioner Infectious Diseases [facility] 01/10/2022, 3:13 PM

## 2022-01-10 NOTE — Telephone Encounter (Signed)
I called and talked to Mom. She said that the PICC line insertion went well today and that Jung received the first dose of antibiotic at home without problems. I told her that I sent in a form for personal care services today and that she should receive a call about that at some point. I will call again tomorrow and check on [place]. TG

## 2022-01-10 NOTE — Procedures (Signed)
Successful placement of single lumen PICC line to right brachial vein. Length 33 cm Tip at lower SVC/RA PICC capped No complications Ready for use.  EBL < 5 mL   Tyson Alias, AGNP 01/10/2022 2:32 PM

## 2022-01-10 NOTE — Telephone Encounter (Signed)
I called and left a message for Mom. I will call her again later today. TG

## 2022-01-10 NOTE — Telephone Encounter (Signed)
Fax successfully sent to [facility]. Documentation within referral.   SS, [place]

## 2022-01-10 NOTE — Telephone Encounter (Signed)
I will contact the ID office to talk with them about Mom's concerns.   I put in order for referral for [place]. [place], could you help with this? Thanks, Otila Kluver

## 2022-01-17 ENCOUNTER — Ambulatory Visit (INDEPENDENT_AMBULATORY_CARE_PROVIDER_SITE_OTHER): Payer: BC Managed Care – PPO | Admitting: Dietician

## 2022-01-17 DIAGNOSIS — Z931 Gastrostomy status: Secondary | ICD-10-CM | POA: Diagnosis not present

## 2022-01-17 DIAGNOSIS — R638 Other symptoms and signs concerning food and fluid intake: Secondary | ICD-10-CM | POA: Diagnosis not present

## 2022-01-17 NOTE — Patient Instructions (Signed)
Nutrition Recommendations sent to [email or web address]:  - Continue current regimen.  - Just add in 1 more 100-150 mL water bolus throughout the day.  - Our goal rate for 45 minutes would be about 435-445 mL/hr. You are welcome to increase to this as tolerated, increasing 2-3 mL/hr per day.

## 2022-01-23 ENCOUNTER — Other Ambulatory Visit: Payer: Self-pay

## 2022-01-23 ENCOUNTER — Telehealth: Payer: Self-pay

## 2022-01-23 ENCOUNTER — Telehealth (INDEPENDENT_AMBULATORY_CARE_PROVIDER_SITE_OTHER): Payer: BC Managed Care – PPO | Admitting: Internal Medicine

## 2022-01-23 DIAGNOSIS — L89154 Pressure ulcer of sacral region, stage 4: Secondary | ICD-10-CM | POA: Diagnosis not present

## 2022-01-23 DIAGNOSIS — M8660 Other chronic osteomyelitis, unspecified site: Secondary | ICD-10-CM | POA: Diagnosis not present
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
## 2022-01-23 NOTE — Telephone Encounter (Signed)
Orders sent to Carolynn Sayers, RN with [facility] to have PICC pulled after last dose on 1/19 per Dr. Gale Journey.   Beryle Flock, RN

## 2022-01-24 ENCOUNTER — Encounter (HOSPITAL_BASED_OUTPATIENT_CLINIC_OR_DEPARTMENT_OTHER): Payer: BC Managed Care – PPO | Attending: Internal Medicine | Admitting: Internal Medicine

## 2022-01-24 DIAGNOSIS — M4628 Osteomyelitis of vertebra, sacral and sacrococcygeal region: Secondary | ICD-10-CM | POA: Insufficient documentation

## 2022-01-24 DIAGNOSIS — L89320 Pressure ulcer of left buttock, unstageable: Secondary | ICD-10-CM | POA: Insufficient documentation

## 2022-01-24 DIAGNOSIS — G800 Spastic quadriplegic cerebral palsy: Secondary | ICD-10-CM | POA: Diagnosis not present

## 2022-01-24 DIAGNOSIS — G809 Cerebral palsy, unspecified: Secondary | ICD-10-CM | POA: Diagnosis not present
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
## 2022-01-29 NOTE — Progress Notes (Signed)
SHAHAN, STARKS ([id number]) [id number] Page 1 of 8 Visit Report for 01/24/2022 Arrival Information Details Patient Name: Date of Service: [name], [place] 01/24/2022 12:45 PM Medical Record Number: [id number] Patient Account Number: [id number] Date of Birth/Sex: Treating RN: November 12, 1995 (27 y.o. M) Primary Care Colin Ellers: [name] Other Clinician: Referring Jarett Dralle: Treating Olson Lucarelli/Extender: Thereasa Parkin in Treatment: 12 Visit Information History Since Last Visit Added or deleted any medications: No Patient Arrived: Wheel Chair Any new allergies or adverse reactions: No Arrival Time: 13:29 Had a fall or experienced change in No Accompanied By: mom activities of daily living that may affect Transfer Assistance: Manual risk of falls: Patient Identification Verified: Yes Signs or symptoms of abuse/neglect since last visito No Secondary Verification Process Completed: Yes Hospitalized since last visit: No Patient Requires Transmission-Based Precautions: No Implantable device outside of the clinic excluding No Patient Has Alerts: No cellular tissue based products placed in the center since last visit: Has Dressing in Place as Prescribed: Yes Pain Present Now: No Electronic Signature(s) Signed: 01/25/2022 11:33:40 AM By: Erenest Blank Entered By: Erenest Blank on 01/24/2022 13:30:03 -------------------------------------------------------------------------------- Clinic Level of Care Assessment Details Patient Name: Date of Service: [name], [place] 01/24/2022 12:45 PM Medical Record Number: [id number] Patient Account Number: [id number] Date of Birth/Sex: Treating RN: 06/24/1995 (27 y.o. [name] Primary Care Orley Lawry: [name] Other Clinician: Referring Zaylin Runco: Treating Aybree Lanyon/Extender: Thereasa Parkin in Treatment: 12 Clinic Level of Care Assessment Items TOOL 4  Quantity Score X- 1 0 Use when only an EandM is performed on FOLLOW-UP visit ASSESSMENTS - Nursing Assessment / Reassessment X- 1 10 Reassessment of Co-morbidities (includes updates in patient status) X- 1 5 Reassessment of Adherence to Treatment Plan ASSESSMENTS - Wound and Skin A ssessment / Reassessment X - Simple Wound Assessment / Reassessment - one wound 1 5 '[]'$  - 0 Complex Wound Assessment / Reassessment - multiple wounds '[]'$  - 0 Dermatologic / Skin Assessment (not related to wound area) ASSESSMENTS - Focused Assessment '[]'$  - 0 Circumferential Edema Measurements - multi extremities '[]'$  - 0 Nutritional Assessment / Counseling / Intervention EYOB, GODLEWSKI ([id number]) [id number] Page 2 of 8 '[]'$  - 0 Lower Extremity Assessment (monofilament, tuning fork, pulses) '[]'$  - 0 Peripheral Arterial Disease Assessment (using hand held doppler) ASSESSMENTS - Ostomy and/or Continence Assessment and Care '[]'$  - 0 Incontinence Assessment and Management '[]'$  - 0 Ostomy Care Assessment and Management (repouching, etc.) PROCESS - Coordination of Care X - Simple Patient / Family Education for ongoing care 1 15 '[]'$  - 0 Complex (extensive) Patient / Family Education for ongoing care X- 1 10 Staff obtains Programmer, systems, Records, T Results / Process Orders est '[]'$  - 0 Staff telephones HHA, Nursing Homes / Clarify orders / etc '[]'$  - 0 Routine Transfer to another Facility (non-emergent condition) '[]'$  - 0 Routine Hospital Admission (non-emergent condition) '[]'$  - 0 New Admissions / Biomedical engineer / Ordering NPWT Apligraf, etc. , '[]'$  - 0 Emergency Hospital Admission (emergent condition) '[]'$  - 0 Simple Discharge Coordination '[]'$  - 0 Complex (extensive) Discharge Coordination PROCESS - Special Needs '[]'$  - 0 Pediatric / Minor Patient Management '[]'$  - 0 Isolation Patient Management '[]'$  - 0 Hearing / Language / Visual special needs '[]'$  - 0 Assessment of Community assistance  (transportation, D/C planning, etc.) '[]'$  - 0 Additional assistance / Altered mentation '[]'$  - 0 Support Surface(s) Assessment (bed, cushion, seat, etc.) INTERVENTIONS - Wound Cleansing / Measurement X - Simple Wound Cleansing -  one wound 1 5 '[]'$  - 0 Complex Wound Cleansing - multiple wounds X- 1 5 Wound Imaging (photographs - any number of wounds) '[]'$  - 0 Wound Tracing (instead of photographs) X- 1 5 Simple Wound Measurement - one wound '[]'$  - 0 Complex Wound Measurement - multiple wounds INTERVENTIONS - Wound Dressings X - Small Wound Dressing one or multiple wounds 1 10 '[]'$  - 0 Medium Wound Dressing one or multiple wounds '[]'$  - 0 Large Wound Dressing one or multiple wounds '[]'$  - 0 Application of Medications - topical '[]'$  - 0 Application of Medications - injection INTERVENTIONS - Miscellaneous '[]'$  - 0 External ear exam '[]'$  - 0 Specimen Collection (cultures, biopsies, blood, body fluids, etc.) '[]'$  - 0 Specimen(s) / Culture(s) sent or taken to Lab for analysis X- 1 10 Patient Transfer (multiple staff / Civil Service fast streamer / Similar devices) '[]'$  - 0 Simple Staple / Suture removal (25 or less) '[]'$  - 0 Complex Staple / Suture removal (26 or more) '[]'$  - 0 Hypo / Hyperglycemic Management (close monitor of Blood Glucose) Propes, Lilton G ([id number]) [id number] Page 3 of 8 '[]'$  - 0 Ankle / Brachial Index (ABI) - do not check if billed separately X- 1 5 Vital Signs Has the patient been seen at the hospital within the last three years: Yes Total Score: 85 Level Of Care: New/Established - Level 3 Electronic Signature(s) Signed: 01/29/2022 3:57:27 PM By: [name] RN, BSN Entered By: [name] on 01/24/2022 14:34:26 -------------------------------------------------------------------------------- Encounter Discharge Information Details Patient Name: Date of Service: Maxwell [name], [name] 01/24/2022 12:45 PM Medical Record Number: [id number] Patient Account Number:  [id number] Date of Birth/Sex: Treating RN: [birth year] (27 y.o. [name] Primary Care Diandre Merica: [name] Other Clinician: Referring Cabrina Shiroma: Treating Camille Dragan/Extender: Thereasa Parkin in Treatment: 12 Encounter Discharge Information Items Discharge Condition: Stable Ambulatory Status: Wheelchair Discharge Destination: Home Transportation: Private Auto Accompanied By: mother Schedule Follow-up Appointment: Yes Clinical Summary of Care: Patient Declined Electronic Signature(s) Signed: 01/29/2022 3:57:27 PM By: [name] RN, BSN Entered By: [name] on 01/24/2022 14:35:24 -------------------------------------------------------------------------------- Lower Extremity Assessment Details Patient Name: Date of Service: [name], [name] Neither. 01/24/2022 12:45 PM Medical Record Number: [id number] Patient Account Number: [id number] Date of Birth/Sex: Treating RN: 1995-10-28 (27 y.o. M) Primary Care Rontrell Moquin: [name] Other Clinician: Referring Ayomikun Starling: Treating Govani Radloff/Extender: Thereasa Parkin in Treatment: 12 Electronic Signature(s) Signed: 01/25/2022 11:33:40 AM By: Erenest Blank Entered By: Erenest Blank on 01/24/2022 13:33:03 -------------------------------------------------------------------------------- Multi Wound Chart Details Patient Name: Date of Service: Maxwell [name], [name] 01/24/2022 12:45 PM Medical Record Number: [id number] Patient Account Number: [id number] CORY, KITT ([id number]) [id number] Page 4 of 8 Date of Birth/Sex: Treating RN: 02-26-1995 (27 y.o. M) Primary Care Kru Allman: Other Clinician: [name] Referring Ercel Normoyle: Treating Kailly Richoux/Extender: Thereasa Parkin in Treatment: 12 Vital Signs Height(in): Pulse(bpm): 104 Weight(lbs): Blood Pressure(mmHg): 114/76 Body Mass Index(BMI): Temperature(F):  97.7 Respiratory Rate(breaths/min): 18 [1:Photos:] [N/A:N/A] Left Ilium N/A N/A Wound Location: Pressure Injury N/A N/A Wounding Event: Pressure Ulcer N/A N/A Primary Etiology: Quadriplegia, Seizure Disorder N/A N/A Comorbid History: 10/08/2021 N/A N/A Date Acquired: 12 N/A N/A Weeks of Treatment: Open N/A N/A Wound Status: No N/A N/A Wound Recurrence: 1.7x1.1x0.5 N/A N/A Measurements L x W x D (cm) 1.469 N/A N/A A (cm) : rea 0.734 N/A N/A Volume (cm) : 91.50% N/A N/A % Reduction in A rea: 78.80% N/A N/A % Reduction in Volume: 12 Position 1 (o'clock): 1.2 Maximum Distance  1 (cm): 12 Starting Position 1 (o'clock): 6 Ending Position 1 (o'clock): 0.6 Maximum Distance 1 (cm): Yes N/A N/A Tunneling: Yes N/A N/A Undermining: Unstageable/Unclassified N/A N/A Classification: Medium N/A N/A Exudate A mount: Serosanguineous N/A N/A Exudate Type: red, brown N/A N/A Exudate Color: Distinct, outline attached N/A N/A Wound Margin: Large (67-100%) N/A N/A Granulation A mount: None Present (0%) N/A N/A Necrotic A mount: Fat Layer (Subcutaneous Tissue): Yes N/A N/A Exposed Structures: Bone: Yes Fascia: No Tendon: No Muscle: No Joint: No None N/A N/A Epithelialization: No Abnormalities Noted N/A N/A Periwound Skin Texture: Maceration: No N/A N/A Periwound Skin Moisture: No Abnormalities Noted N/A N/A Periwound Skin Color: No Abnormality N/A N/A Temperature: Treatment Notes Electronic Signature(s) Signed: 01/24/2022 2:16:30 PM By: Kalman Shan DO Entered By: Kalman Shan on 01/24/2022 14:10:29 Garber Details -------------------------------------------------------------------------------- Ardelle Anton ([id number]) [id number] Page 5 of 8 Patient Name: Date of Service: [name], [place] 01/24/2022 12:45 PM Medical Record Number: [id number] Patient Account Number: [id number] Date of Birth/Sex:  Treating RN: 26-Apr-1995 (27 y.o. [name] Primary Care Neka Bise: [name] Other Clinician: Referring Itzael Liptak: Treating Kolina Kube/Extender: Thereasa Parkin in Treatment: 12 Active Inactive Pressure Nursing Diagnoses: Knowledge deficit related to management of pressures ulcers Goals: Patient will remain free from development of additional pressure ulcers Date Initiated: 10/29/2021 Target Resolution Date: 03/14/2022 Goal Status: Active Interventions: Assess: immobility, friction, shearing, incontinence upon admission and as needed Assess offloading mechanisms upon admission and as needed Assess potential for pressure ulcer upon admission and as needed Provide education on pressure ulcers Notes: Wound/Skin Impairment Nursing Diagnoses: Impaired tissue integrity Knowledge deficit related to ulceration/compromised skin integrity Goals: Patient/caregiver will verbalize understanding of skin care regimen Date Initiated: 10/29/2021 Target Resolution Date: 03/14/2022 Goal Status: Active Ulcer/skin breakdown will have a volume reduction of 30% by week 4 Date Initiated: 10/29/2021 Target Resolution Date: 03/14/2022 Goal Status: Active Interventions: Assess patient/caregiver ability to obtain necessary supplies Assess patient/caregiver ability to perform ulcer/skin care regimen upon admission and as needed Assess ulceration(s) every visit Provide education on ulcer and skin care Notes: Electronic Signature(s) Signed: 01/29/2022 3:57:27 PM By: [name] RN, BSN Entered By: [name] on 01/24/2022 14:03:36 -------------------------------------------------------------------------------- Pain Assessment Details Patient Name: Date of Service: [name], [name] 01/24/2022 12:45 PM Medical Record Number: [id number] Patient Account Number: [id number] Date of Birth/Sex: Treating RN: 1996/01/02 (27 y.o. M) Primary Care Tylicia Sherman: [name] Other Clinician: Referring Carsynn Bethune: Treating Massie Cogliano/Extender: Thereasa Parkin in Treatment: 12 Active Problems Location of Pain Severity and Description of Pain SHAHAB, POLHAMUS ([id number]) [id number] Page 6 of 8 Patient Has Paino No Site Locations Pain Management and Medication Current Pain Management: Electronic Signature(s) Signed: 01/25/2022 11:33:40 AM By: Erenest Blank Entered By: Erenest Blank on 01/24/2022 13:32:55 -------------------------------------------------------------------------------- Patient/Caregiver Education Details Patient Name: Date of Service: [name] 1/11/2024andnbsp12:45 PM Medical Record Number: [id number] Patient Account Number: [id number] Date of Birth/Gender: Treating RN: [birth year] (26 y.o. [name] Primary Care Physician: [name] Other Clinician: Referring Physician: Treating Physician/Extender: Thereasa Parkin in Treatment: 12 Education Assessment Education Provided To: Patient Education Topics Provided Wound/Skin Impairment: Methods: Explain/Verbal Responses: State content correctly [employer]) Signed: 01/29/2022 3:57:27 PM By: [name] RN, BSN Entered By: [name] on 01/24/2022 14:04:08 -------------------------------------------------------------------------------- Wound Assessment Details Patient Name: Date of Service: [name], [name] Neither. 01/24/2022 12:45 PM Medical Record Number: [id number] Patient Account Number: [id number] Date of Birth/Sex: Treating RN:  1995-01-21 (27 y.o. Arbie Cookey ([id number]) [id number] Page 7 of 8 Primary Care Dorman Calderwood: [name] Other Clinician: Referring Braylynn Ghan: Treating Cheney Gosch/Extender: Thereasa Parkin in Treatment: 12 Wound Status Wound Number: 1 Primary Etiology: Pressure Ulcer Wound Location: Left  Ilium Wound Status: Open Wounding Event: Pressure Injury Comorbid History: Quadriplegia, Seizure Disorder Date Acquired: 10/08/2021 Weeks Of Treatment: 12 Clustered Wound: No Photos Wound Measurements Length: (cm) 1.7 Width: (cm) 1.1 Depth: (cm) 0.5 Area: (cm) 1.469 Volume: (cm) 0.734 % Reduction in Area: 91.5% % Reduction in Volume: 78.8% Epithelialization: None Tunneling: Yes Position (o'clock): 12 Maximum Distance: (cm) 1.2 Undermining: Yes Starting Position (o'clock): 12 Ending Position (o'clock): 6 Maximum Distance: (cm) 0.6 Wound Description Classification: Unstageable/Unclassified Wound Margin: Distinct, outline attached Exudate Amount: Medium Exudate Type: Serosanguineous Exudate Color: red, brown Foul Odor After Cleansing: No Slough/Fibrino Yes Wound Bed Granulation Amount: Large (67-100%) Exposed Structure Granulation Quality: Hyper-granulation Fascia Exposed: No Necrotic Amount: None Present (0%) Fat Layer (Subcutaneous Tissue) Exposed: Yes Tendon Exposed: No Muscle Exposed: No Joint Exposed: No Bone Exposed: Yes Periwound Skin Texture Texture Color No Abnormalities Noted: Yes No Abnormalities Noted: Yes Moisture Temperature / Pain No Abnormalities Noted: No Temperature: No Abnormality Maceration: No Treatment Notes Wound #1 (Ilium) Wound Laterality: Left Cleanser Wound Cleanser Discharge Instruction: Cleanse the wound with wound cleanser prior to applying a clean dressing using gauze sponges, not tissue or cotton balls. Peri-Wound Care Skin Prep COHEN, BOETTNER ([id number]) [id number] Page 8 of 8 Discharge Instruction: Use skin prep as directed Topical Primary Dressing Dakin's Solution 0.25%, 16 (oz) Discharge Instruction: Moisten gauze with Dakin's solution Secondary Dressing Woven Gauze Sponge, Non-Sterile 4x4 in Discharge Instruction: Apply over primary dressing as directed. Zetuvit Plus Silicone Border Sacrum  Dressing, Sm, 7x7 (in/in) Discharge Instruction: Apply silicone border over primary dressing as directed. Secured With Conforming Stretch Gauze Bandage, Sterile 2x75 (in/in) Discharge Instruction: Secure with stretch gauze as directed. 59M Medipore H Soft Cloth Surgical T ape, 4 x 10 (in/yd) Discharge Instruction: Secure with tape as directed. Compression Wrap Compression Stockings Add-Ons Electronic Signature(s) Signed: 01/25/2022 11:33:40 AM By: Erenest Blank Entered By: Erenest Blank on 01/24/2022 13:48:33 -------------------------------------------------------------------------------- Vitals Details Patient Name: Date of Service: Maxwell [name], [name] 01/24/2022 12:45 PM Medical Record Number: [id number] Patient Account Number: [id number] Date of Birth/Sex: Treating RN: 1995/03/19 (27 y.o. M) Primary Care Aston Lawhorn: [name] Other Clinician: Referring Marquiz Sotelo: Treating Yang Rack/Extender: Thereasa Parkin in Treatment: 12 Vital Signs Time Taken: 13:30 Temperature (F): 97.7 Pulse (bpm): 104 Respiratory Rate (breaths/min): 18 Blood Pressure (mmHg): 114/76 Reference Range: 80 - 120 mg / dl Electronic Signature(s) Signed: 01/25/2022 11:33:40 AM By: Erenest Blank Entered By: Erenest Blank on 01/24/2022 13:32:49

## 2022-02-21 ENCOUNTER — Telehealth: Payer: Self-pay

## 2022-02-21 ENCOUNTER — Encounter (HOSPITAL_BASED_OUTPATIENT_CLINIC_OR_DEPARTMENT_OTHER): Payer: BC Managed Care – PPO | Attending: Internal Medicine | Admitting: Internal Medicine

## 2022-02-21 DIAGNOSIS — L89320 Pressure ulcer of left buttock, unstageable: Secondary | ICD-10-CM | POA: Insufficient documentation

## 2022-02-21 DIAGNOSIS — G809 Cerebral palsy, unspecified: Secondary | ICD-10-CM | POA: Diagnosis not present

## 2022-02-21 DIAGNOSIS — M4628 Osteomyelitis of vertebra, sacral and sacrococcygeal region: Secondary | ICD-10-CM

## 2022-02-21 DIAGNOSIS — G40909 Epilepsy, unspecified, not intractable, without status epilepticus: Secondary | ICD-10-CM | POA: Insufficient documentation

## 2022-02-21 DIAGNOSIS — G8250 Quadriplegia, unspecified: Secondary | ICD-10-CM | POA: Diagnosis not present

## 2022-02-21 DIAGNOSIS — K219 Gastro-esophageal reflux disease without esophagitis: Secondary | ICD-10-CM | POA: Insufficient documentation

## 2022-02-21 NOTE — Telephone Encounter (Signed)
Spoke to patients mother and relayed message below per Dr. Gale Journey. She verbalized understanding and had no further questions.

## 2022-02-21 NOTE — Telephone Encounter (Signed)
Received a call from patients mother wanting to discuss lab work before patients picc line is pulled today.

## 2022-02-21 NOTE — Telephone Encounter (Signed)
Left voice message for patients mother to contact office.
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
## 2022-02-22 NOTE — Progress Notes (Signed)
KALVEN, ARGENT ([id number]) [id number] Page 1 of 8 Visit Report for 02/21/2022 Arrival Information Details Patient Name: Date of Service: [name], [place] 02/21/2022 1:30 PM Medical Record Number: [id number] Patient Account Number: [id number] Date of Birth/Sex: Treating RN: Nov 13, 1995 (27 y.o. M) Primary Care Buffie Herne: [name] Other Clinician: Referring Sharah Finnell: Treating Qusai Kem/Extender: Thereasa Parkin in Treatment: 16 Visit Information History Since Last Visit Added or deleted any medications: No Patient Arrived: Wheel Chair Any new allergies or adverse reactions: No Arrival Time: 14:00 Had a fall or experienced change in No Accompanied By: mother activities of daily living that may affect Transfer Assistance: Manual risk of falls: Patient Identification Verified: Yes Signs or symptoms of abuse/neglect since last visito No Secondary Verification Process Completed: Yes Hospitalized since last visit: No Patient Requires Transmission-Based Precautions: No Implantable device outside of the clinic excluding No Patient Has Alerts: No cellular tissue based products placed in the center since last visit: Has Dressing in Place as Prescribed: Yes Pain Present Now: No Electronic Signature(s) Signed: 02/22/2022 1:13:01 PM By: Erenest Blank Entered By: Erenest Blank on 02/21/2022 14:04:01 -------------------------------------------------------------------------------- Clinic Level of Care Assessment Details Patient Name: Date of Service: [name], [place] 02/21/2022 1:30 PM Medical Record Number: [id number] Patient Account Number: [id number] Date of Birth/Sex: Treating RN: 1995/06/03 (27 y.o. [name] Primary Care Tivis Wherry: [name] Other Clinician: Referring Oneisha Ammons: Treating Lamya Lausch/Extender: Thereasa Parkin in Treatment: 16 Clinic Level of Care Assessment Items TOOL 4 Quantity  Score X- 1 0 Use when only an EandM is performed on FOLLOW-UP visit ASSESSMENTS - Nursing Assessment / Reassessment X- 1 10 Reassessment of Co-morbidities (includes updates in patient status) X- 1 5 Reassessment of Adherence to Treatment Plan ASSESSMENTS - Wound and Skin A ssessment / Reassessment X - Simple Wound Assessment / Reassessment - one wound 1 5 []$  - 0 Complex Wound Assessment / Reassessment - multiple wounds []$  - 0 Dermatologic / Skin Assessment (not related to wound area) ASSESSMENTS - Focused Assessment []$  - 0 Circumferential Edema Measurements - multi extremities []$  - 0 Nutritional Assessment / Counseling / Intervention [name], [name] ([id number]) [id number] Page 2 of 8 []$  - 0 Lower Extremity Assessment (monofilament, tuning fork, pulses) []$  - 0 Peripheral Arterial Disease Assessment (using hand held doppler) ASSESSMENTS - Ostomy and/or Continence Assessment and Care []$  - 0 Incontinence Assessment and Management []$  - 0 Ostomy Care Assessment and Management (repouching, etc.) PROCESS - Coordination of Care X - Simple Patient / Family Education for ongoing care 1 15 []$  - 0 Complex (extensive) Patient / Family Education for ongoing care X- 1 10 Staff obtains Programmer, systems, Records, T Results / Process Orders est []$  - 0 Staff telephones HHA, Nursing Homes / Clarify orders / etc []$  - 0 Routine Transfer to another Facility (non-emergent condition) []$  - 0 Routine Hospital Admission (non-emergent condition) X- 1 15 New Admissions / Biomedical engineer / Ordering NPWT Apligraf, etc. , []$  - 0 Emergency Hospital Admission (emergent condition) []$  - 0 Simple Discharge Coordination []$  - 0 Complex (extensive) Discharge Coordination PROCESS - Special Needs []$  - 0 Pediatric / Minor Patient Management []$  - 0 Isolation Patient Management []$  - 0 Hearing / Language / Visual special needs []$  - 0 Assessment of Community assistance  (transportation, D/C planning, etc.) []$  - 0 Additional assistance / Altered mentation []$  - 0 Support Surface(s) Assessment (bed, cushion, seat, etc.) INTERVENTIONS - Wound Cleansing / Measurement X - Simple Wound Cleansing -  one wound 1 5 []$  - 0 Complex Wound Cleansing - multiple wounds X- 1 5 Wound Imaging (photographs - any number of wounds) []$  - 0 Wound Tracing (instead of photographs) X- 1 5 Simple Wound Measurement - one wound []$  - 0 Complex Wound Measurement - multiple wounds INTERVENTIONS - Wound Dressings []$  - 0 Small Wound Dressing one or multiple wounds []$  - 0 Medium Wound Dressing one or multiple wounds []$  - 0 Large Wound Dressing one or multiple wounds []$  - 0 Application of Medications - topical []$  - 0 Application of Medications - injection INTERVENTIONS - Miscellaneous []$  - 0 External ear exam []$  - 0 Specimen Collection (cultures, biopsies, blood, body fluids, etc.) []$  - 0 Specimen(s) / Culture(s) sent or taken to Lab for analysis []$  - 0 Patient Transfer (multiple staff / Civil Service fast streamer / Similar devices) []$  - 0 Simple Staple / Suture removal (25 or less) []$  - 0 Complex Staple / Suture removal (26 or more) []$  - 0 Hypo / Hyperglycemic Management (close monitor of Blood Glucose) [name], [name] ([id number]) [id number] Page 3 of 8 []$  - 0 Ankle / Brachial Index (ABI) - do not check if billed separately X- 1 5 Vital Signs Has the patient been seen at the hospital within the last three years: Yes Total Score: 80 Level Of Care: New/Established - Level 3 Electronic Signature(s) Signed: 02/21/2022 5:19:15 PM By: [name] RN, BSN Entered By: [name] on 02/21/2022 16:32:42 -------------------------------------------------------------------------------- Encounter Discharge Information Details Patient Name: Date of Service: Maxwell [name], MA TTHEW [place]. 02/21/2022 1:30 PM Medical Record Number: [id number] Patient Account Number:  [id number] Date of Birth/Sex: Treating RN: 08/02/1995 (27 y.o. [name] Primary Care Jamika Sadek: [name] Other Clinician: Referring Eilidh Marcano: Treating Aune Adami/Extender: Thereasa Parkin in Treatment: 16 Encounter Discharge Information Items Discharge Condition: Stable Ambulatory Status: Wheelchair Discharge Destination: Home Transportation: Private Auto Accompanied By: mother Schedule Follow-up Appointment: Yes Clinical Summary of Care: Patient Declined Electronic Signature(s) Signed: 02/21/2022 5:19:15 PM By: [name] RN, BSN Entered By: [name] on 02/21/2022 16:33:34 -------------------------------------------------------------------------------- Lower Extremity Assessment Details Patient Name: Date of Service: [name], Maxwell Hauser Rivers. 02/21/2022 1:30 PM Medical Record Number: [id number] Patient Account Number: [id number] Date of Birth/Sex: Treating RN: [birth year] (27 y.o. M) Primary Care Zandrea Kenealy: [name] Other Clinician: Referring Aspasia Rude: Treating Daleyza Gadomski/Extender: Thereasa Parkin in Treatment: 16 Electronic Signature(s) Signed: 02/22/2022 1:13:01 PM By: Erenest Blank Entered By: Erenest Blank on 02/21/2022 14:04:59 -------------------------------------------------------------------------------- Multi Wound Chart Details Patient Name: Date of Service: Maxwell [name], MA TTHEW [place]. 02/21/2022 1:30 PM Medical Record Number: [id number] Patient Account Number: [id number] ERO, DEVENEY ([id number]) [id number] Page 4 of 8 Date of Birth/Sex: Treating RN: Jul 02, 1995 (27 y.o. M) Primary Care Neasia Fleeman: Other Clinician: [name] Referring Bonifacio Pruden: Treating Rishi Vicario/Extender: Thereasa Parkin in Treatment: 16 Vital Signs Height(in): Pulse(bpm): 118 Weight(lbs): Blood Pressure(mmHg): 122/81 Body Mass Index(BMI): Temperature(F): 98.2 Respiratory  Rate(breaths/min): 18 [1:Photos:] [N/A:N/A] Left Ilium N/A N/A Wound Location: Pressure Injury N/A N/A Wounding Event: Pressure Ulcer N/A N/A Primary Etiology: Quadriplegia, Seizure Disorder N/A N/A Comorbid History: 10/08/2021 N/A N/A Date Acquired: 16 N/A N/A Weeks of Treatment: Open N/A N/A Wound Status: No N/A N/A Wound Recurrence: 1.8x1.2x0.5 N/A N/A Measurements L x W x D (cm) 1.696 N/A N/A A (cm) : rea 0.848 N/A N/A Volume (cm) : 90.20% N/A N/A % Reduction in A rea: 75.50% N/A N/A % Reduction in Volume: 1 Starting Position 1 (o'clock): 3 Ending Position  1 (o'clock): 1.3 Maximum Distance 1 (cm): Yes N/A N/A Undermining: Unstageable/Unclassified N/A N/A Classification: Medium N/A N/A Exudate A mount: Serosanguineous N/A N/A Exudate Type: red, brown N/A N/A Exudate Color: Distinct, outline attached N/A N/A Wound Margin: Large (67-100%) N/A N/A Granulation A mount: Friable N/A N/A Granulation Quality: None Present (0%) N/A N/A Necrotic A mount: Fat Layer (Subcutaneous Tissue): Yes N/A N/A Exposed Structures: Bone: Yes Fascia: No Tendon: No Muscle: No Joint: No None N/A N/A Epithelialization: No Abnormalities Noted N/A N/A Periwound Skin Texture: Maceration: No N/A N/A Periwound Skin Moisture: Dry/Scaly: No No Abnormalities Noted N/A N/A Periwound Skin Color: No Abnormality N/A N/A Temperature: Treatment Notes Electronic Signature(s) Signed: 02/21/2022 3:04:17 PM By: Kalman Shan DO Entered By: Kalman Shan on 02/21/2022 14:50:27 [facility] Details -------------------------------------------------------------------------------- Ardelle Rivers ([id number]) [id number] Page 5 of 8 Patient Name: Date of Service: [name], [place] 02/21/2022 1:30 PM Medical Record Number: [id number] Patient Account Number: [id number] Date of Birth/Sex: Treating RN: [birth year] (27 y.o. [name] Primary Care Bernardine Langworthy: [name] Other Clinician: Referring Demont Linford: Treating Berk Pilot/Extender: Thereasa Parkin in Treatment: 16 Active Inactive Pressure Nursing Diagnoses: Knowledge deficit related to management of pressures ulcers Goals: Patient will remain free from development of additional pressure ulcers Date Initiated: 10/29/2021 Target Resolution Date: 03/14/2022 Goal Status: Active Interventions: Assess: immobility, friction, shearing, incontinence upon admission and as needed Assess offloading mechanisms upon admission and as needed Assess potential for pressure ulcer upon admission and as needed Provide education on pressure ulcers Notes: Wound/Skin Impairment Nursing Diagnoses: Impaired tissue integrity Knowledge deficit related to ulceration/compromised skin integrity Goals: Patient/caregiver will verbalize understanding of skin care regimen Date Initiated: 10/29/2021 Target Resolution Date: 03/14/2022 Goal Status: Active Ulcer/skin breakdown will have a volume reduction of 30% by week 4 Date Initiated: 10/29/2021 Target Resolution Date: 03/14/2022 Goal Status: Active Interventions: Assess patient/caregiver ability to obtain necessary supplies Assess patient/caregiver ability to perform ulcer/skin care regimen upon admission and as needed Assess ulceration(s) every visit Provide education on ulcer and skin care Notes: Electronic Signature(s) Signed: 02/21/2022 5:19:15 PM By: [name] RN, BSN Entered By: [name] on 02/21/2022 14:39:29 -------------------------------------------------------------------------------- Pain Assessment Details Patient Name: Date of Service: Maxwell [name], MA TTHEW [place]. 02/21/2022 1:30 PM Medical Record Number: [id number] Patient Account Number: [id number] Date of Birth/Sex: Treating RN: [birth year] (27 y.o. M) Primary Care Jacalynn Buzzell: [name] Other Clinician: Referring  Arath Kaigler: Treating Estiven Kohan/Extender: Thereasa Parkin in Treatment: 16 Active Problems Location of Pain Severity and Description of Pain Patient Has Paino No DEMITRE, BORTS ([id number]) [id number] Page 6 of 8 Patient Has Paino No Site Locations Pain Management and Medication Current Pain Management: Electronic Signature(s) Signed: 02/22/2022 1:13:01 PM By: Erenest Blank Entered By: Erenest Blank on 02/21/2022 14:04:41 -------------------------------------------------------------------------------- Patient/Caregiver Education Details Patient Name: Date of Service: [name] 2/8/2024andnbsp1:30 PM Medical Record Number: [id number] Patient Account Number: [id number] Date of Birth/Gender: Treating RN: [birth year] (26 y.o. [name] Primary Care Physician: [name] Other Clinician: Referring Physician: Treating Physician/Extender: Thereasa Parkin in Treatment: 16 Education Assessment Education Provided To: Patient Education Topics Provided Wound/Skin Impairment: Methods: Explain/Verbal Responses: State content correctly [employer]) Signed: 02/21/2022 5:19:15 PM By: [name] RN, BSN Entered By: [name] on 02/21/2022 14:39:46 -------------------------------------------------------------------------------- Wound Assessment Details Patient Name: Date of Service: [name], Maxwell Hauser Rivers. 02/21/2022 1:30 PM Medical Record Number: [id number] Patient Account Number: [id number] Date of Birth/Sex: Treating RN: 23-Oct-1995 (27 y.o.  Arbie Cookey ([id number]) [id number] Page 7 of 8 Primary Care Shauni Henner: [name] Other Clinician: Referring Allisha Harter: Treating Avneet Ashmore/Extender: Thereasa Parkin in Treatment: 16 Wound Status Wound Number: 1 Primary Etiology: Pressure Ulcer Wound Location: Left Ilium Wound Status:  Open Wounding Event: Pressure Injury Comorbid History: Quadriplegia, Seizure Disorder Date Acquired: 10/08/2021 Weeks Of Treatment: 16 Clustered Wound: No Photos Wound Measurements Length: (cm) 1.8 Width: (cm) 1.2 Depth: (cm) 0.5 Area: (cm) 1.696 Volume: (cm) 0.848 % Reduction in Area: 90.2% % Reduction in Volume: 75.5% Epithelialization: None Undermining: Yes Starting Position (o'clock): 1 Ending Position (o'clock): 3 Maximum Distance: (cm) 1.3 Wound Description Classification: Unstageable/Unclassified Wound Margin: Distinct, outline attached Exudate Amount: Medium Exudate Type: Serosanguineous Exudate Color: red, brown Foul Odor After Cleansing: No Slough/Fibrino Yes Wound Bed Granulation Amount: Large (67-100%) Exposed Structure Granulation Quality: Friable Fascia Exposed: No Necrotic Amount: None Present (0%) Fat Layer (Subcutaneous Tissue) Exposed: Yes Tendon Exposed: No Muscle Exposed: No Joint Exposed: No Bone Exposed: Yes Periwound Skin Texture Texture Color No Abnormalities Noted: Yes No Abnormalities Noted: Yes Moisture Temperature / Pain No Abnormalities Noted: No Temperature: No Abnormality Dry / Scaly: No Maceration: No Treatment Notes Wound #1 (Ilium) Wound Laterality: Left Cleanser Wound Cleanser Discharge Instruction: Cleanse the wound with wound cleanser prior to applying a clean dressing using gauze sponges, not tissue or cotton balls. Peri-Wound Care Skin Prep Discharge Instruction: Use skin prep as directed Topical LORD, BADLEY ([id number]) [id number] Page 8 of 8 Primary Dressing Dakin's Solution 0.25%, 16 (oz) Discharge Instruction: Moisten gauze with Dakin's solution Promogran Prisma Matrix, 4.34 (sq in) (silver collagen) Discharge Instruction: Moisten collagen with saline or hydrogel Secondary Dressing Woven Gauze Sponge, Non-Sterile 4x4 in Discharge Instruction: Apply over primary dressing as  directed. Zetuvit Plus Silicone Border Dressing 5x5 (in/in) Discharge Instruction: Apply silicone border over primary dressing as directed. Secured With Conforming Stretch Gauze Bandage, Sterile 2x75 (in/in) Discharge Instruction: Secure with stretch gauze as directed. 52M Medipore H Soft Cloth Surgical T ape, 4 x 10 (in/yd) Discharge Instruction: Secure with tape as directed. Compression Wrap Compression Stockings Add-Ons Electronic Signature(s) Signed: 02/22/2022 1:13:01 PM By: Erenest Blank Entered By: Erenest Blank on 02/21/2022 14:23:33 -------------------------------------------------------------------------------- Vitals Details Patient Name: Date of Service: Maxwell [name], [place] TTHEW [place]. 02/21/2022 1:30 PM Medical Record Number: [id number] Patient Account Number: [id number] Date of Birth/Sex: Treating RN: Jul 09, 1995 (27 y.o. M) Primary Care Tyner Codner: [name] Other Clinician: Referring Shantal Roan: Treating Talor Cheema/Extender: Thereasa Parkin in Treatment: 16 Vital Signs Time Taken: 14:04 Temperature (F): 98.2 Pulse (bpm): 118 Respiratory Rate (breaths/min): 18 Blood Pressure (mmHg): 122/81 Reference Range: 80 - 120 mg / dl Electronic Signature(s) Signed: 02/22/2022 1:13:01 PM By: Erenest Blank Entered By: Erenest Blank on 02/21/2022 14:04:30

## 2022-02-28 ENCOUNTER — Telehealth: Payer: Self-pay | Admitting: Internal Medicine

## 2022-02-28 NOTE — Telephone Encounter (Signed)
Got a call from ortho/friend of patient and his family, dr Berenice Primas wanting update on this patient  I called patient's mother who confirmed it is ok to discuss   Called and update on our plan (have finished antibiotics and if further treatment desired, patient needs to be at tertiary center --> colostomy, I&D of tissue/bone, biopsy/culture, starting targetted therapy and prompt flap coverage of wound with aggressive off loading there after

## 2022-03-01 ENCOUNTER — Ambulatory Visit: Payer: BC Managed Care – PPO | Admitting: Internal Medicine

## 2022-03-07 ENCOUNTER — Encounter (HOSPITAL_BASED_OUTPATIENT_CLINIC_OR_DEPARTMENT_OTHER): Payer: BC Managed Care – PPO | Admitting: Internal Medicine

## 2022-03-07 DIAGNOSIS — G809 Cerebral palsy, unspecified: Secondary | ICD-10-CM | POA: Diagnosis not present

## 2022-03-07 DIAGNOSIS — L89320 Pressure ulcer of left buttock, unstageable: Secondary | ICD-10-CM | POA: Diagnosis not present

## 2022-03-07 DIAGNOSIS — M4628 Osteomyelitis of vertebra, sacral and sacrococcygeal region: Secondary | ICD-10-CM | POA: Diagnosis not present
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
## 2022-03-13 NOTE — Progress Notes (Addendum)
[name], [name] ([id number] Page 1 of 8 Visit Report for 03/07/2022 Arrival Information Details Patient Name: Date of Service: [name], [place] 03/07/2022 2:15 PM Medical Record Number: [id number] Patient Account Number: [id number] Date of Birth/Sex: Treating RN: 1995/12/04 (27 y.o. [name] Primary Care Ahley Bulls: [name] Other Clinician: Referring Leveda Kendrix: Treating Chet Greenley/Extender: Thereasa Parkin in Treatment: 18 Visit Information History Since Last Visit Added [name] deleted any medications: No Patient Arrived: Wheel Chair Any new allergies [name] adverse reactions: No Arrival Time: 14:39 Had a fall [name] experienced change in No Accompanied By: mother activities of daily living that may affect Transfer Assistance: Manual risk of falls: Patient Identification Verified: Yes Signs [name] symptoms of abuse/neglect since last visito No Secondary Verification Process Completed: Yes Hospitalized since last visit: No Patient Requires Transmission-Based Precautions: No Implantable device outside of the clinic excluding No Patient Has Alerts: No cellular tissue based products placed in the center since last visit: Has Dressing in Place as Prescribed: Yes Pain Present Now: No Electronic Signature(s) Signed: 03/12/2022 4:02:57 PM By: [name] RN, BSN Entered By: [name] on 03/07/2022 14:40:04 -------------------------------------------------------------------------------- Clinic Level of Care Assessment Details Patient Name: Date of Service: [name], [place] 03/07/2022 2:15 PM Medical Record Number: [id number] Patient Account Number: [id number] Date of Birth/Sex: Treating RN: May 09, 1995 (27 y.o. [name] Primary Care Myrl Bynum: [name] Other Clinician: Referring Lanier Felty: Treating Rook Maue/Extender: Thereasa Parkin in Treatment: 18 Clinic Level of Care  Assessment Items TOOL 4 Quantity Score X- 1 0 Use when only an EandM is performed on FOLLOW-UP visit ASSESSMENTS - Nursing Assessment / Reassessment X- 1 10 Reassessment of Co-morbidities (includes updates in patient status) X- 1 5 Reassessment of Adherence to Treatment Plan ASSESSMENTS - Wound and Skin A ssessment / Reassessment X - Simple Wound Assessment / Reassessment - one wound 1 5 '[]'$  - 0 Complex Wound Assessment / Reassessment - multiple wounds '[]'$  - 0 Dermatologic / Skin Assessment (not related to wound area) ASSESSMENTS - Focused Assessment '[]'$  - 0 Circumferential Edema Measurements - multi extremities '[]'$  - 0 Nutritional Assessment / Counseling / Intervention '[]'$  - 0 Lower Extremity Assessment (monofilament, tuning fork, pulses) '[]'$  - 0 Peripheral Arterial Disease Assessment (using hand held doppler) ASSESSMENTS - Ostomy and/[name] Continence Assessment and Care '[]'$  - 0 Incontinence Assessment and Management '[]'$  - 0 Ostomy Care Assessment and Management (repouching, etc.) PROCESS - Coordination of Care X - Simple Patient / Family Education for ongoing care 1 15 [name], [name] ([id number] Page 2 of 8 '[]'$  - 0 Complex (extensive) Patient / Family Education for ongoing care X- 1 10 Staff obtains Consents, Records, T Results / Process Orders est X- 1 10 Staff telephones HHA, Nursing Homes / Clarify orders / etc '[]'$  - 0 Routine Transfer to another Facility (non-emergent condition) '[]'$  - 0 Routine Hospital Admission (non-emergent condition) '[]'$  - 0 New Admissions / Biomedical engineer / Ordering NPWT Apligraf, etc. , '[]'$  - 0 Emergency Hospital Admission (emergent condition) '[]'$  - 0 Simple Discharge Coordination '[]'$  - 0 Complex (extensive) Discharge Coordination PROCESS - Special Needs '[]'$  - 0 Pediatric / Minor Patient Management '[]'$  - 0 Isolation Patient Management '[]'$  - 0 Hearing / Language / Visual special needs '[]'$  - 0 Assessment  of Community assistance (transportation, D/C planning, etc.) '[]'$  - 0 Additional assistance / Altered mentation '[]'$  - 0 Support Surface(s) Assessment (bed, cushion, seat, etc.) INTERVENTIONS - Wound Cleansing / Measurement X -  Simple Wound Cleansing - one wound 1 5 '[]'$  - 0 Complex Wound Cleansing - multiple wounds X- 1 5 Wound Imaging (photographs - any number of wounds) '[]'$  - 0 Wound Tracing (instead of photographs) X- 1 5 Simple Wound Measurement - one wound '[]'$  - 0 Complex Wound Measurement - multiple wounds INTERVENTIONS - Wound Dressings X - Small Wound Dressing one [name] multiple wounds 1 10 '[]'$  - 0 Medium Wound Dressing one [name] multiple wounds '[]'$  - 0 Large Wound Dressing one [name] multiple wounds '[]'$  - 0 Application of Medications - topical '[]'$  - 0 Application of Medications - injection INTERVENTIONS - Miscellaneous '[]'$  - 0 External ear exam '[]'$  - 0 Specimen Collection (cultures, biopsies, blood, body fluids, etc.) '[]'$  - 0 Specimen(s) / Culture(s) sent [name] taken to Lab for analysis '[]'$  - 0 Patient Transfer (multiple staff / Civil Service fast streamer / Similar devices) '[]'$  - 0 Simple Staple / Suture removal (25 [name] less) '[]'$  - 0 Complex Staple / Suture removal (26 [name] more) '[]'$  - 0 Hypo / Hyperglycemic Management (close monitor of Blood Glucose) '[]'$  - 0 Ankle / Brachial Index (ABI) - do not check if billed separately X- 1 5 Vital Signs Has the patient been seen at the hospital within the last three years: Yes Total Score: 85 Level Of Care: New/Established - Level 3 Electronic Signature(s) Signed: 03/12/2022 4:02:57 PM By: [name] RN, BSN Entered By: [name] on 03/07/2022 15:11:24 [name] ([id number] Page 3 of 8 -------------------------------------------------------------------------------- Complex / Palliative Patient Assessment Details Patient Name: Date of Service: [name], [place] 03/07/2022 2:15 PM Medical Record Number:  [id number] Patient Account Number: [id number] Date of Birth/Sex: Treating RN: 1995-11-21 (27 y.o. [name] Primary Care Chiffon Kittleson: [name] Other Clinician: Referring Caleen Taaffe: Treating Willodean Leven/Extender: Thereasa Parkin in Treatment: 18 Complex Wound Management Criteria Patient has remarkable [name] complex co-morbidities requiring medications [name] treatments that extend wound healing times. 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, Celebral palesy, Rivers tube, spinal fusion, reflux, immobility Palliative Wound Management Criteria Care Approach Wound Care Plan: Complex Wound Management Electronic Signature(s) Signed: 03/19/2022 5:29:34 PM By: [name] RN, BSN Signed: 03/27/2022 11:59:03 AM By: Kalman Shan DO Entered By: [name] on 03/19/2022 17:21:03 -------------------------------------------------------------------------------- Encounter Discharge Information Details Patient Name: Date of Service: [name], [name] 03/07/2022 2:15 PM Medical Record Number: [id number] Patient Account Number: [id number] Date of Birth/Sex: Treating RN: December 18, 1995 (27 y.o. [name] Primary Care Tima Curet: [name] Other Clinician: Referring Piccola Arico: Treating Damarian Priola/Extender: Thereasa Parkin in Treatment: 18 Encounter Discharge Information Items Discharge Condition: Stable Ambulatory Status: Wheelchair Discharge Destination: Home Transportation: Private Auto Accompanied By: mother Schedule Follow-up Appointment: Yes Clinical Summary of Care: Patient Declined Electronic Signature(s) Signed: 03/12/2022 4:02:57 PM By: [name] RN, BSN Entered By:  [name] on 03/07/2022 15:22:23 -------------------------------------------------------------------------------- Lower Extremity Assessment Details Patient Name: Date of Service: [name], [place] 03/07/2022 2:15 PM Medical Record Number: [id number] Patient Account Number: [id number] Date of Birth/Sex: Treating RN: Apr 15, 1995 (27 y.o. [name] Primary Care Aleecia Tapia: [name] Other Clinician: Referring Chanita Boden: Treating Roby Spalla/Extender: Thereasa Parkin in Treatment: 18 Electronic Signature(s) Signed: 03/12/2022 4:02:57 PM By: [name] RN, BSN Entered By: [name] on 03/07/2022 14:41:14 [name], [name]  Darnell Level ([id number] Page 4 of 8 -------------------------------------------------------------------------------- Multi Wound Chart Details Patient Name: Date of Service: [name], [place] 03/07/2022 2:15 PM Medical Record Number: [id number] Patient Account Number: [id number] Date of Birth/Sex: Treating RN: Dec 20, 1995 (28 y.o. M) Primary Care Dorse Locy: [name] Other Clinician: Referring Melainie Krinsky: Treating Lavina Resor/Extender: Thereasa Parkin in Treatment: 18 Vital Signs Height(in): Pulse(bpm): 120 Weight(lbs): Blood Pressure(mmHg): 117/86 Body Mass Index(BMI): Temperature(F): 97.5 Respiratory Rate(breaths/min): 18 [1:Photos:] [N/A:N/A] Left Ilium N/A N/A Wound Location: Pressure Injury N/A N/A Wounding Event: Pressure Ulcer N/A N/A Primary Etiology: Quadriplegia, Seizure Disorder N/A N/A Comorbid History: 10/08/2021 N/A N/A Date Acquired: 70 N/A N/A Weeks of Treatment: Open N/A N/A Wound Status: No N/A N/A Wound Recurrence: 1.2x0.5x1.6 N/A N/A Measurements L x W x D (cm) 0.471 N/A N/A A (cm) : rea 0.754 N/A N/A Volume (cm) : 97.30% N/A N/A % Reduction in A rea: 78.20% N/A N/A % Reduction in Volume: Unstageable/Unclassified N/A  N/A Classification: Medium N/A N/A Exudate A mount: Serosanguineous N/A N/A Exudate Type: red, brown N/A N/A Exudate Color: Distinct, outline attached N/A N/A Wound Margin: Large (67-100%) N/A N/A Granulation A mount: Red, Friable N/A N/A Granulation Quality: Small (1-33%) N/A N/A Necrotic A mount: Fat Layer (Subcutaneous Tissue): Yes N/A N/A Exposed Structures: Bone: Yes Fascia: No Tendon: No Muscle: No Joint: No None N/A N/A Epithelialization: No Abnormalities Noted N/A N/A Periwound Skin Texture: Maceration: No N/A N/A Periwound Skin Moisture: Dry/Scaly: No No Abnormalities Noted N/A N/A Periwound Skin Color: No Abnormality N/A N/A Temperature: Treatment Notes Electronic Signature(s) Signed: 03/07/2022 4:16:06 PM By: Kalman Shan DO Entered By: Kalman Shan on 03/07/2022 15:12:27 -------------------------------------------------------------------------------- Multi-Disciplinary Care Plan Details Patient Name: Date of Service: [name], [name]. 03/07/2022 2:15 PM Medical Record Number: [id number] Patient Account Number: [id number] [name], [name] ([id number]) [id number] Page 5 of 8 Date of Birth/Sex: Treating RN: August 27, 1995 (27 y.o. [name] Primary Care Awa Bachicha: Other Clinician: [name] Referring Matthan Sledge: Treating Gillis Boardley/Extender: Thereasa Parkin in Treatment: 18 Active Inactive Pressure Nursing Diagnoses: Knowledge deficit related to management of pressures ulcers Goals: Patient will remain free from development of additional pressure ulcers Date Initiated: 10/29/2021 Target Resolution Date: 03/14/2022 Goal Status: Active Interventions: Assess: immobility, friction, shearing, incontinence upon admission and as needed Assess offloading mechanisms upon admission and as needed Assess potential for pressure ulcer upon admission and as needed Provide education on pressure  ulcers Notes: Wound/Skin Impairment Nursing Diagnoses: Impaired tissue integrity Knowledge deficit related to ulceration/compromised skin integrity Goals: Patient/caregiver will verbalize understanding of skin care regimen Date Initiated: 10/29/2021 Target Resolution Date: 03/14/2022 Goal Status: Active Ulcer/skin breakdown will have a volume reduction of 30% by week 4 Date Initiated: 10/29/2021 Target Resolution Date: 03/14/2022 Goal Status: Active Interventions: Assess patient/caregiver ability to obtain necessary supplies Assess patient/caregiver ability to perform ulcer/skin care regimen upon admission and as needed Assess ulceration(s) every visit Provide education on ulcer and skin care Notes: Electronic Signature(s) Signed: 03/12/2022 4:02:57 PM By: [name] RN, BSN Entered By: [name] on 03/07/2022 14:58:10 -------------------------------------------------------------------------------- Pain Assessment Details Patient Name: Date of Service: [name], [name] 03/07/2022 2:15 PM Medical Record Number: [id number] Patient Account Number: [id number] Date of Birth/Sex: Treating RN: 1995-08-11 (27 y.o. [name] Primary Care Danah Reinecke: [name] Other Clinician: Referring Abhijot Straughter: Treating Mayukha Symmonds/Extender: Thereasa Parkin in Treatment: 18 Active Problems Location of Pain Severity and Description of Pain Patient Has Paino No Site Locations [name], [name] (  [id number] Page 6 of 8 Pain Management and Medication Current Pain Management: Electronic Signature(s) Signed: 03/12/2022 4:02:57 PM By: [name] RN, BSN Entered By: [name] on 03/07/2022 14:40:52 -------------------------------------------------------------------------------- Patient/Caregiver Education Details Patient Name: Date of Service: [name] 2/22/2024andnbsp2:15 PM Medical Record Number:  [id number] Patient Account Number: [id number] Date of Birth/Gender: Treating RN: [birth year] (26 y.o. [name] Primary Care Physician: [name] Other Clinician: Referring Physician: Treating Physician/Extender: Thereasa Parkin in Treatment: 18 Education Assessment Education Provided To: Patient Education Topics Provided Wound/Skin Impairment: Methods: Explain/Verbal Responses: State content correctly [employer]) Signed: 03/12/2022 4:02:57 PM By: [name] RN, BSN Entered By: [name] on 03/07/2022 14:57:58 -------------------------------------------------------------------------------- Wound Assessment Details Patient Name: Date of Service: [name], [name] 03/07/2022 2:15 PM Medical Record Number: [id number] Patient Account Number: [id number] Date of Birth/Sex: Treating RN: 11-02-1995 (27 y.o. [name] Primary Care Darrelle Wiberg: [name] Other Clinician: Referring Fraidy Mccarrick: Treating Azyriah Nevins/Extender: Thereasa Parkin in Treatment: 18 Wound Status Wound Number: 1 Primary Etiology: Pressure Ulcer Wound Location: Left Ilium Wound Status: Open Wounding Event: Pressure Injury Comorbid History: Quadriplegia, Seizure Disorder Date Acquired: 10/08/2021 Weeks Of Treatment: 18 Clustered Wound: No [name], [name] ([id number]) [id number] Page 7 of 8 Photos Wound Measurements Length: (cm) 1.2 Width: (cm) 0.5 Depth: (cm) 1.6 Area: (cm) 0.471 Volume: (cm) 0.754 % Reduction in Area: 97.3% % Reduction in Volume: 78.2% Epithelialization: None Tunneling: No Undermining: No Wound Description Classification: Unstageable/Unclassified Wound Margin: Distinct, outline attached Exudate Amount: Medium Exudate Type: Serosanguineous Exudate Color: red, brown Foul Odor After Cleansing: No Slough/Fibrino Yes Wound Bed Granulation Amount: Large (67-100%) Exposed  Structure Granulation Quality: Red, Friable Fascia Exposed: No Necrotic Amount: Small (1-33%) Fat Layer (Subcutaneous Tissue) Exposed: Yes Necrotic Quality: Adherent Slough Tendon Exposed: No Muscle Exposed: No Joint Exposed: No Bone Exposed: Yes Periwound Skin Texture Texture Color No Abnormalities Noted: Yes No Abnormalities Noted: Yes Moisture Temperature / Pain No Abnormalities Noted: No Temperature: No Abnormality Dry / Scaly: No Maceration: No Electronic Signature(s) Signed: 03/12/2022 4:02:57 PM By: [name] RN, BSN Entered By: [name] on 03/07/2022 15:05:15 -------------------------------------------------------------------------------- Vitals Details Patient Name: Date of Service: Maxwell [name], Maxwell TTHEW Rivers. 03/07/2022 2:15 PM Medical Record Number: [id number] Patient Account Number: [id number] Date of Birth/Sex: Treating RN: 29-Sep-1995 (27 y.o. [name] Primary Care Ondra Deboard: [name] Other Clinician: Referring Shaneece Stockburger: Treating Theon Sobotka/Extender: Thereasa Parkin in Treatment: 18 Vital Signs Time Taken: 14:38 Temperature (F): 97.5 Pulse (bpm): 120 Respiratory Rate (breaths/min): 18 Blood Pressure (mmHg): 117/86 Reference Range: 80 - 120 mg / dl Electronic Signature(s) Signed: 03/12/2022 4:02:57 PM By: [name] RN, BSN [place], Gerrit Heck ([id number]) PM By: [name] RN, BSN [phone].pdf Page 8 of 8 Signed: 03/12/2022 4:02:57 Entered By: [name] on 03/07/2022 14:40:33

## 2022-03-15 ENCOUNTER — Ambulatory Visit (HOSPITAL_BASED_OUTPATIENT_CLINIC_OR_DEPARTMENT_OTHER): Payer: BC Managed Care – PPO | Admitting: Internal Medicine

## 2022-03-19 ENCOUNTER — Telehealth: Payer: Self-pay | Admitting: Pediatrics
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
## 2022-03-21 ENCOUNTER — Telehealth (INDEPENDENT_AMBULATORY_CARE_PROVIDER_SITE_OTHER): Payer: Self-pay | Admitting: Family

## 2022-03-21 DIAGNOSIS — G8250 Quadriplegia, unspecified: Secondary | ICD-10-CM

## 2022-03-21 MED ORDER — BACLOFEN 5 MG PO TABS: 1.0000 | ORAL_TABLET | Freq: Two times a day (BID) | ORAL | 3 refills | Status: DC

## 2022-03-21 NOTE — Telephone Encounter (Signed)
I called and left a message for Mom. I will call back later today. TG

## 2022-03-21 NOTE — Telephone Encounter (Signed)
  Name of who is calling:Michele   Caller's Relationship to Patient:mother   Best contact number:[phone]  Provider they ON:[id number] [place]   Reason for call:mom called requesting a call back from [facility] with medication questions she has. Please advise      PRESCRIPTION REFILL ONLY  Name of prescription:  Pharmacy:

## 2022-03-21 NOTE — Telephone Encounter (Signed)
I called and talked with Mom. She had questions about continuing the Baclofen. I recommended continuing it for now to help with spasticity and pain. TG

## 2022-03-22 ENCOUNTER — Encounter (HOSPITAL_BASED_OUTPATIENT_CLINIC_OR_DEPARTMENT_OTHER): Payer: BC Managed Care – PPO | Attending: Internal Medicine | Admitting: Internal Medicine

## 2022-03-22 DIAGNOSIS — M4628 Osteomyelitis of vertebra, sacral and sacrococcygeal region: Secondary | ICD-10-CM | POA: Diagnosis not present

## 2022-03-22 DIAGNOSIS — G40909 Epilepsy, unspecified, not intractable, without status epilepticus: Secondary | ICD-10-CM | POA: Diagnosis not present

## 2022-03-22 DIAGNOSIS — L89320 Pressure ulcer of left buttock, unstageable: Secondary | ICD-10-CM | POA: Diagnosis not present

## 2022-03-22 DIAGNOSIS — Z981 Arthrodesis status: Secondary | ICD-10-CM | POA: Diagnosis not present

## 2022-03-22 DIAGNOSIS — G809 Cerebral palsy, unspecified: Secondary | ICD-10-CM | POA: Diagnosis not present

## 2022-03-28 NOTE — Progress Notes (Signed)
YARON, RINDERKNECHT ([id number]) [id number] Page 1 of 8 Visit Report for 03/22/2022 Arrival Information Details Patient Name: Date of Service: [name], [place] 03/22/2022 10:15 A M Medical Record Number: [id number] Patient Account Number: [id number] Date of Birth/Sex: Treating RN: 1995-08-07 (27 y.o. Burnadette Pop, Lauren Primary Care Everest Hacking: [name] Other Clinician: Referring Teige Rountree: Treating Marlene Beidler/Extender: Thereasa Parkin in Treatment: 20 Visit Information History Since Last Visit Added or deleted any medications: No Patient Arrived: Wheel Chair Any new allergies or adverse reactions: No Arrival Time: 10:36 Had a fall or experienced change in No Accompanied By: MOM activities of daily living that may affect Transfer Assistance: [name] Lift risk of falls: Patient Identification Verified: Yes Signs or symptoms of abuse/neglect since last visito No Secondary Verification Process Completed: Yes Hospitalized since last visit: No Patient Requires Transmission-Based Precautions: No Implantable device outside of the clinic excluding No Patient Has Alerts: No cellular tissue based products placed in the center since last visit: Has Dressing in Place as Prescribed: Yes Pain Present Now: No Electronic Signature(s) Signed: 03/27/2022 4:00:04 PM By: Rhae Hammock RN Entered By: Rhae Hammock on 03/22/2022 10:36:54 -------------------------------------------------------------------------------- Clinic Level of Care Assessment Details Patient Name: Date of Service: [name], [name] 03/22/2022 10:15 A M Medical Record Number: [id number] Patient Account Number: [id number] Date of Birth/Sex: Treating RN: 1995-10-14 (27 y.o. Burnadette Pop, Lauren Primary Care Manas Hickling: [name] Other Clinician: Referring Irwin Toran: Treating Khalea Ventura/Extender: Thereasa Parkin in Treatment: 20 Clinic Level of  Care Assessment Items TOOL 4 Quantity Score X- 1 0 Use when only an EandM is performed on FOLLOW-UP visit ASSESSMENTS - Nursing Assessment / Reassessment X- 1 10 Reassessment of Co-morbidities (includes updates in patient status) X- 1 5 Reassessment of Adherence to Treatment Plan ASSESSMENTS - Wound and Skin A ssessment / Reassessment X - Simple Wound Assessment / Reassessment - one wound 1 5 '[]'$  - 0 Complex Wound Assessment / Reassessment - multiple wounds '[]'$  - 0 Dermatologic / Skin Assessment (not related to wound area) ASSESSMENTS - Focused Assessment '[]'$  - 0 Circumferential Edema Measurements - multi extremities '[]'$  - 0 Nutritional Assessment / Counseling / Intervention '[]'$  - 0 Lower Extremity Assessment (monofilament, tuning fork, pulses) '[]'$  - 0 Peripheral Arterial Disease Assessment (using hand held doppler) ASSESSMENTS - Ostomy and/or Continence Assessment and Care '[]'$  - 0 Incontinence Assessment and Management '[]'$  - 0 Ostomy Care Assessment and Management (repouching, etc.) PROCESS - Coordination of Care '[]'$  - 0 Simple Patient / Family Education for ongoing care TYKEEM, ZAYA ([id number]) [id number] Page 2 of 8 X- 1 20 Complex (extensive) Patient / Family Education for ongoing care X- 1 10 Staff obtains Consents, Records, T Results / Process Orders est X- 1 10 Staff telephones HHA, Nursing Homes / Clarify orders / etc '[]'$  - 0 Routine Transfer to another Facility (non-emergent condition) '[]'$  - 0 Routine Hospital Admission (non-emergent condition) '[]'$  - 0 New Admissions / Biomedical engineer / Ordering NPWT Apligraf, etc. , '[]'$  - 0 Emergency Hospital Admission (emergent condition) X- 1 10 Simple Discharge Coordination '[]'$  - 0 Complex (extensive) Discharge Coordination PROCESS - Special Needs '[]'$  - 0 Pediatric / Minor Patient Management '[]'$  - 0 Isolation Patient Management '[]'$  - 0 Hearing / Language / Visual special needs '[]'$  -  0 Assessment of Community assistance (transportation, D/C planning, etc.) '[]'$  - 0 Additional assistance / Altered mentation '[]'$  - 0 Support Surface(s) Assessment (bed, cushion, seat, etc.) INTERVENTIONS - Wound Cleansing / Measurement X -  Simple Wound Cleansing - one wound 1 5 '[]'$  - 0 Complex Wound Cleansing - multiple wounds X- 1 5 Wound Imaging (photographs - any number of wounds) '[]'$  - 0 Wound Tracing (instead of photographs) X- 1 5 Simple Wound Measurement - one wound '[]'$  - 0 Complex Wound Measurement - multiple wounds INTERVENTIONS - Wound Dressings X - Small Wound Dressing one or multiple wounds 1 10 '[]'$  - 0 Medium Wound Dressing one or multiple wounds '[]'$  - 0 Large Wound Dressing one or multiple wounds X- 1 5 Application of Medications - topical '[]'$  - 0 Application of Medications - injection INTERVENTIONS - Miscellaneous '[]'$  - 0 External ear exam '[]'$  - 0 Specimen Collection (cultures, biopsies, blood, body fluids, etc.) '[]'$  - 0 Specimen(s) / Culture(s) sent or taken to Lab for analysis '[]'$  - 0 Patient Transfer (multiple staff / Civil Service fast streamer / Similar devices) '[]'$  - 0 Simple Staple / Suture removal (25 or less) '[]'$  - 0 Complex Staple / Suture removal (26 or more) '[]'$  - 0 Hypo / Hyperglycemic Management (close monitor of Blood Glucose) '[]'$  - 0 Ankle / Brachial Index (ABI) - do not check if billed separately X- 1 5 Vital Signs Has the patient been seen at the hospital within the last three years: Yes Total Score: 105 Level Of Care: New/Established - Level 3 Electronic Signature(s) Signed: 03/27/2022 4:00:04 PM By: Rhae Hammock RN Entered By: Rhae Hammock on 03/22/2022 11:25:11 Ardelle Anton ([id number].pdf Page 3 of 8 -------------------------------------------------------------------------------- Encounter Discharge Information Details Patient Name: Date of Service: [name], [place] 03/22/2022 10:15 A M Medical Record Number:  [id number] Patient Account Number: [id number] Date of Birth/Sex: Treating RN: 1995-11-20 (27 y.o. Burnadette Pop, Lauren Primary Care Jovi Zavadil: [name] Other Clinician: Referring Jaspal Pultz: Treating Dallas Torok/Extender: Thereasa Parkin in Treatment: 20 Encounter Discharge Information Items Discharge Condition: Stable Ambulatory Status: Wheelchair Discharge Destination: Home Transportation: Private Auto Accompanied By: mom Schedule Follow-up Appointment: Yes Clinical Summary of Care: Patient Declined Electronic Signature(s) Signed: 03/27/2022 4:00:04 PM By: Rhae Hammock RN Entered By: Rhae Hammock on 03/22/2022 11:28:15 -------------------------------------------------------------------------------- Lower Extremity Assessment Details Patient Name: Date of Service: [name], [name] 03/22/2022 10:15 A M Medical Record Number: [id number] Patient Account Number: [id number] Date of Birth/Sex: Treating RN: August 27, 1995 (26 y.o. Erie Noe Primary Care Shardea Cwynar: [name] Other Clinician: Referring Aureliano Oshields: Treating Eura Mccauslin/Extender: Thereasa Parkin in Treatment: 20 Electronic Signature(s) Signed: 03/27/2022 4:00:04 PM By: Rhae Hammock RN Entered By: Rhae Hammock on 03/22/2022 10:36:27 -------------------------------------------------------------------------------- Multi Wound Chart Details Patient Name: Date of Service: [name], [name] 03/22/2022 10:15 A M Medical Record Number: [id number] Patient Account Number: [id number] Date of Birth/Sex: Treating RN: August 28, 1995 (27 y.o. M) Primary Care Tiani Stanbery: [name] Other Clinician: Referring Toron Bowring: Treating Ovella Manygoats/Extender: Thereasa Parkin in Treatment: 20 Vital Signs Height(in): Pulse(bpm): 135 Weight(lbs): Blood Pressure(mmHg): 108/76 Body Mass Index(BMI): Temperature(F): 98.4 Respiratory  Rate(breaths/min): 17 [1:Photos:] [N/A:N/A] DOTSON, GRASER ([id number]) [1:Left Ilium Wound Location: Pressure Injury Wounding Event: Pressure Ulcer Primary Etiology: Quadriplegia, Seizure Disorder Comorbid History: 10/08/2021 Date Acquired: 20 Weeks of Treatment: Open Wound Status: No Wound Recurrence: 1.4x1.2x1.4  Measurements L x W x D (cm) 1.319 A (cm) : rea 1.847 Volume (cm) : 92.40% % Reduction in A rea: 46.60% % Reduction in Volume: 10 Starting Position 1 (o'clock): 2 Ending Position 1 (o'clock): 1.9 Maximum Distance 1 (cm): Yes Undermining:  Unstageable/Unclassified Classification: Medium Exudate A mount: Serosanguineous Exudate Type: red, brown  Exudate Color: Distinct, outline attached Wound Margin: Large (67-100%) Granulation A mount: Red, Friable Granulation Quality: Small (1-33%)  Necrotic A mount: Fat Layer (Subcutaneous Tissue): Yes N/A Exposed Structures: Bone: Yes Fascia: No Tendon: No Muscle: No Joint: No None Epithelialization: No Abnormalities Noted Periwound Skin Texture: Maceration: No Periwound Skin Moisture: Dry/Scaly:  No No Abnormalities Noted Periwound Skin Color: No Abnormality Temperature:] [N/A:N/A N/A N/A N/A N/A N/A N/A N/A N/A N/A N/A N/A N/A N/A N/A N/A N/A N/A N/A N/A N/A N/A N/A N/A N/A N/A N/A] Treatment Notes Electronic Signature(s) Signed: 03/22/2022 12:19:22 PM By: Kalman Shan DO Entered By: Kalman Shan on 03/22/2022 11:17:06 -------------------------------------------------------------------------------- Multi-Disciplinary Care Plan Details Patient Name: Date of Service: [name], [name] 03/22/2022 10:15 A M Medical Record Number: [id number] Patient Account Number: [id number] Date of Birth/Sex: Treating RN: [birth year] (27 y.o. Burnadette Pop, Lauren Primary Care Junia Nygren: [name] Other Clinician: Referring Jeweline Reif: Treating Razan Siler/Extender: Thereasa Parkin in Treatment: 20 Active Inactive Pressure Nursing  Diagnoses: Knowledge deficit related to management of pressures ulcers Goals: Patient will remain free from development of additional pressure ulcers Date Initiated: 10/29/2021 Target Resolution Date: 04/13/2022 Goal Status: Active Interventions: Assess: immobility, friction, shearing, incontinence upon admission and as needed Assess offloading mechanisms upon admission and as needed Assess potential for pressure ulcer upon admission and as needed Provide education on pressure ulcers NotesGAIGE, BEHRMANN ([id number]) [phone].pdf Page 5 of 8 Wound/Skin Impairment Nursing Diagnoses: Impaired tissue integrity Knowledge deficit related to ulceration/compromised skin integrity Goals: Patient/caregiver will verbalize understanding of skin care regimen Date Initiated: 10/29/2021 Target Resolution Date: 04/13/2022 Goal Status: Active Ulcer/skin breakdown will have a volume reduction of 30% by week 4 Date Initiated: 10/29/2021 Target Resolution Date: 04/13/2022 Goal Status: Active Interventions: Assess patient/caregiver ability to obtain necessary supplies Assess patient/caregiver ability to perform ulcer/skin care regimen upon admission and as needed Assess ulceration(s) every visit Provide education on ulcer and skin care Notes: Electronic Signature(s) Signed: 03/27/2022 4:00:04 PM By: Rhae Hammock RN Entered By: Rhae Hammock on 03/22/2022 10:38:08 -------------------------------------------------------------------------------- Pain Assessment Details Patient Name: Date of Service: [name], [name] 03/22/2022 10:15 A M Medical Record Number: [id number] Patient Account Number: [id number] Date of Birth/Sex: Treating RN: 04-08-1995 (27 y.o. Burnadette Pop, Lauren Primary Care Bart Ashford: [name] Other Clinician: Referring Vencil Basnett: Treating Jaelynn Currier/Extender: Thereasa Parkin in Treatment: 20 Active Problems Location of  Pain Severity and Description of Pain Patient Has Paino No Site Locations Pain Management and Medication Current Pain Management: Electronic Signature(s) Signed: 03/27/2022 4:00:04 PM By: Rhae Hammock RN Entered By: Rhae Hammock on 03/22/2022 10:36:34 Patient/Caregiver Education Details -------------------------------------------------------------------------------- Ardelle Anton ([id number]) [id number] Page 6 of 8 Patient Name: Date of Service: [name], [place] 3/8/2024andnbsp10:15 A M Medical Record Number: [id number] Patient Account Number: [id number] Date of Birth/Gender: Treating RN: Feb 02, 1995 (27 y.o. Erie Noe Primary Care Physician: [name] Other Clinician: Referring Physician: Treating Physician/Extender: Thereasa Parkin in Treatment: 20 Education Assessment Education Provided To: Patient Education Topics Provided Wound/Skin Impairment: Methods: Explain/Verbal Responses: Reinforcements needed, State content correctly [employer]) Signed: 03/27/2022 4:00:04 PM By: Rhae Hammock RN Entered By: Rhae Hammock on 03/22/2022 10:38:33 -------------------------------------------------------------------------------- Wound Assessment Details Patient Name: Date of Service: [name], [name] 03/22/2022 10:15 A M Medical Record Number: [id number] Patient Account Number: [id number] Date of Birth/Sex: Treating RN: 06/03/1995 (27 y.o. Burnadette Pop, Lauren Primary Care Charvez Voorhies: [name] Other Clinician: Referring Noella Kipnis: Treating Niya Behler/Extender: Legrand Rams,  Andres Weeks in Treatment: 20 Wound Status Wound Number: 1 Primary Etiology: Pressure Ulcer Wound Location: Left Ilium Wound Status: Open Wounding Event: Pressure Injury Comorbid History: Quadriplegia, Seizure Disorder Date Acquired: 10/08/2021 Weeks Of Treatment: 20 Clustered Wound:  No Photos Wound Measurements Length: (cm) 1.4 Width: (cm) 1.2 Depth: (cm) 1.4 Area: (cm) 1.319 Volume: (cm) 1.847 % Reduction in Area: 92.4% % Reduction in Volume: 46.6% Epithelialization: None Undermining: Yes Starting Position (o'clock): 10 Ending Position (o'clock): 2 Maximum Distance: (cm) 1.9 Wound Description Classification: Unstageable/Unclassified Wound Margin: Distinct, outline attached Exudate Amount: Medium Exudate Type: Serosanguineous Exudate Color: red, brown Linebaugh, Ethanael G ([id number]) Foul Odor After Cleansing: No Slough/Fibrino Yes [phone].pdf Page 7 of 8 Wound Bed Granulation Amount: Large (67-100%) Exposed Structure Granulation Quality: Red, Friable Fascia Exposed: No Necrotic Amount: Small (1-33%) Fat Layer (Subcutaneous Tissue) Exposed: Yes Necrotic Quality: Adherent Slough Tendon Exposed: No Muscle Exposed: No Joint Exposed: No Bone Exposed: Yes Periwound Skin Texture Texture Color No Abnormalities Noted: Yes No Abnormalities Noted: Yes Moisture Temperature / Pain No Abnormalities Noted: No Temperature: No Abnormality Dry / Scaly: No Maceration: No Treatment Notes Wound #1 (Ilium) Wound Laterality: Left Cleanser Wound Cleanser Discharge Instruction: Cleanse the wound with wound cleanser prior to applying a clean dressing using gauze sponges, not tissue or cotton balls. Peri-Wound Care Skin Prep Discharge Instruction: Use skin prep as directed Topical Primary Dressing Dakin's Solution 0.25%, 16 (oz) Discharge Instruction: Moisten gauze with Dakin's solution Promogran Prisma Matrix, 4.34 (sq in) (silver collagen) Discharge Instruction: Moisten collagen with saline or hydrogel Secondary Dressing Zetuvit Plus Silicone Border Dressing 5x5 (in/in) Discharge Instruction: Apply silicone border over primary dressing as directed. Secured With Compression Wrap Compression Stockings Sport and exercise psychologist) Signed: 03/27/2022 4:00:04 PM By: Rhae Hammock RN Entered By: Rhae Hammock on 03/22/2022 10:59:24 -------------------------------------------------------------------------------- Vitals Details Patient Name: Date of Service: Maxwell [name], [name] 03/22/2022 10:15 A M Medical Record Number: [id number] Patient Account Number: [id number] Date of Birth/Sex: Treating RN: 15-Aug-1995 (27 y.o. Burnadette Pop, Lauren Primary Care Neda Willenbring: [name] Other Clinician: Referring Carlye Panameno: Treating Latrena Benegas/Extender: Thereasa Parkin in Treatment: 20 Vital Signs Time Taken: 10:47 Temperature (F): 98.4 Pulse (bpm): 135 Respiratory Rate (breaths/min): 17 Blood Pressure (mmHg): 108/76 Reference Range: 80 - 120 mg / dl MARKANTHONY, LOCKLAR ([id number]) [phone].pdf Page 8 of 8 Electronic Signature(s) Signed: 03/27/2022 4:00:04 PM By: Rhae Hammock RN Entered By: Rhae Hammock on 03/22/2022 10:47:58
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
## 2022-04-04 ENCOUNTER — Encounter (HOSPITAL_BASED_OUTPATIENT_CLINIC_OR_DEPARTMENT_OTHER): Payer: BC Managed Care – PPO | Admitting: Internal Medicine

## 2022-04-04 DIAGNOSIS — G809 Cerebral palsy, unspecified: Secondary | ICD-10-CM | POA: Diagnosis not present

## 2022-04-04 DIAGNOSIS — L89320 Pressure ulcer of left buttock, unstageable: Secondary | ICD-10-CM | POA: Diagnosis not present

## 2022-04-04 DIAGNOSIS — M4628 Osteomyelitis of vertebra, sacral and sacrococcygeal region: Secondary | ICD-10-CM | POA: Diagnosis not present
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
## 2022-04-11 ENCOUNTER — Telehealth: Payer: Self-pay | Admitting: Pediatrics

## 2022-04-11 NOTE — Telephone Encounter (Signed)
Letter written for Rodman Key by Dr.Ram. Emailed the letter to [email or web address] and placed the letter up front in patient folders.

## 2022-04-11 NOTE — Telephone Encounter (Signed)
Letter for extra extension sets

## 2022-04-17 NOTE — Progress Notes (Signed)
ARLIS, GAYLER ([id number]) [id number] Page 1 of 7 Visit Report for 04/04/2022 Arrival Information Details Patient Name: Date of Service: [name], [place] 04/04/2022 1:15 PM Medical Record Number: [id number] Patient Account Number: [id number] Date of Birth/Sex: Treating RN: 03-12-1995 (27 y.o. M) Primary Care Tristain Daily: [name] Other Clinician: Referring Crystie Yanko: Treating Leslee Haueter/Extender: Thereasa Parkin in Treatment: 58 Visit Information History Since Last Visit Added or deleted any medications: No Patient Arrived: Wheel Chair Any new allergies or adverse reactions: No Arrival Time: 13:35 Had a fall or experienced change in No Accompanied By: parents activities of daily living that may affect Transfer Assistance: Manual risk of falls: Patient Identification Verified: Yes Signs or symptoms of abuse/neglect since last visito No Secondary Verification Process Completed: Yes Hospitalized since last visit: No Patient Requires Transmission-Based Precautions: No Implantable device outside of the clinic excluding No Patient Has Alerts: No cellular tissue based products placed in the center since last visit: Has Dressing in Place as Prescribed: Yes Pain Present Now: No Electronic Signature(s) Signed: 04/08/2022 4:16:21 PM By: Erenest Blank Entered By: Erenest Blank on 04/04/2022 13:36:01 -------------------------------------------------------------------------------- Clinic Level of Care Assessment Details Patient Name: Date of Service: [name] 04/04/2022 1:15 PM Medical Record Number: [id number] Patient Account Number: [id number] Date of Birth/Sex: Treating RN: September 17, 1995 (27 y.o. Burnadette Pop, Lauren Primary Care Libbie Bartley: [name] Other Clinician: Referring Desarai Barrack: Treating Rashied Corallo/Extender: Thereasa Parkin in Treatment: 22 Clinic Level of Care Assessment Items TOOL 4  Quantity Score X- 1 0 Use when only an EandM is performed on FOLLOW-UP visit ASSESSMENTS - Nursing Assessment / Reassessment X- 1 10 Reassessment of Co-morbidities (includes updates in patient status) X- 1 5 Reassessment of Adherence to Treatment Plan ASSESSMENTS - Wound and Skin A ssessment / Reassessment X - Simple Wound Assessment / Reassessment - one wound 1 5 []  - 0 Complex Wound Assessment / Reassessment - multiple wounds []  - 0 Dermatologic / Skin Assessment (not related to wound area) ASSESSMENTS - Focused Assessment []  - 0 Circumferential Edema Measurements - multi extremities []  - 0 Nutritional Assessment / Counseling / Intervention []  - 0 Lower Extremity Assessment (monofilament, tuning fork, pulses) []  - 0 Peripheral Arterial Disease Assessment (using hand held doppler) ASSESSMENTS - Ostomy and/or Continence Assessment and Care []  - 0 Incontinence Assessment and Management []  - 0 Ostomy Care Assessment and Management (repouching, etc.) PROCESS - Coordination of Care X - Simple Patient / Family Education for ongoing care 1 15 [name], [name] ([id number]) [id number] Page 2 of 7 []  - 0 Complex (extensive) Patient / Family Education for ongoing care X- 1 10 Staff obtains Programmer, systems, Records, T Results / Process Orders est []  - 0 Staff telephones HHA, Nursing Homes / Clarify orders / etc []  - 0 Routine Transfer to another Facility (non-emergent condition) []  - 0 Routine Hospital Admission (non-emergent condition) []  - 0 New Admissions / Biomedical engineer / Ordering NPWT Apligraf, etc. , []  - 0 Emergency Hospital Admission (emergent condition) X- 1 10 Simple Discharge Coordination []  - 0 Complex (extensive) Discharge Coordination PROCESS - Special Needs []  - 0 Pediatric / Minor Patient Management []  - 0 Isolation Patient Management []  - 0 Hearing / Language / Visual special needs []  - 0 Assessment of Community assistance  (transportation, D/C planning, etc.) []  - 0 Additional assistance / Altered mentation []  - 0 Support Surface(s) Assessment (bed, cushion, seat, etc.) INTERVENTIONS - Wound Cleansing / Measurement X - Simple Wound Cleansing -  one wound 1 5 []  - 0 Complex Wound Cleansing - multiple wounds X- 1 5 Wound Imaging (photographs - any number of wounds) []  - 0 Wound Tracing (instead of photographs) X- 1 5 Simple Wound Measurement - one wound []  - 0 Complex Wound Measurement - multiple wounds INTERVENTIONS - Wound Dressings X - Small Wound Dressing one or multiple wounds 1 10 []  - 0 Medium Wound Dressing one or multiple wounds []  - 0 Large Wound Dressing one or multiple wounds X- 1 5 Application of Medications - topical []  - 0 Application of Medications - injection INTERVENTIONS - Miscellaneous []  - 0 External ear exam []  - 0 Specimen Collection (cultures, biopsies, blood, body fluids, etc.) []  - 0 Specimen(s) / Culture(s) sent or taken to Lab for analysis []  - 0 Patient Transfer (multiple staff / Civil Service fast streamer / Similar devices) []  - 0 Simple Staple / Suture removal (25 or less) []  - 0 Complex Staple / Suture removal (26 or more) []  - 0 Hypo / Hyperglycemic Management (close monitor of Blood Glucose) []  - 0 Ankle / Brachial Index (ABI) - do not check if billed separately X- 1 5 Vital Signs Has the patient been seen at the hospital within the last three years: Yes Total Score: 90 Level Of Care: New/Established - Level 3 Electronic Signature(s) Signed: 04/17/2022 4:26:45 PM By: Rhae Hammock RN Entered By: Rhae Hammock on 04/08/2022 14:51:36 [name] ([id number]) [id number] Page 3 of 7 -------------------------------------------------------------------------------- Lower Extremity Assessment Details Patient Name: Date of Service: [name], [place] 04/04/2022 1:15 PM Medical Record Number: [id number] Patient Account Number:  [id number] Date of Birth/Sex: Treating RN: Oct 24, 1995 (27 y.o. M) Primary Care Karah Caruthers: [name] Other Clinician: Referring Gavon Majano: Treating Lorynn Moeser/Extender: Thereasa Parkin in Treatment: 22 Electronic Signature(s) Signed: 04/08/2022 4:16:21 PM By: Erenest Blank Entered By: Erenest Blank on 04/04/2022 13:38:26 -------------------------------------------------------------------------------- Multi Wound Chart Details Patient Name: Date of Service: [name], [name] Neither 04/04/2022 1:15 PM Medical Record Number: [id number] Patient Account Number: [id number] Date of Birth/Sex: Treating RN: Dec 03, 1995 (27 y.o. M) Primary Care Deiontae Rabel: [name] Other Clinician: Referring Blia Totman: Treating Arlesia Kiel/Extender: Thereasa Parkin in Treatment: 22 Vital Signs Height(in): Pulse(bpm): Weight(lbs): Blood Pressure(mmHg): Body Mass Index(BMI): Temperature(F): 97.5 Respiratory Rate(breaths/min): 17 [1:Photos:] [N/A:N/A] Left Ilium N/A N/A Wound Location: Pressure Injury N/A N/A Wounding Event: Pressure Ulcer N/A N/A Primary Etiology: Quadriplegia, Seizure Disorder N/A N/A Comorbid History: 10/08/2021 N/A N/A Date Acquired: 22 N/A N/A Weeks of Treatment: Open N/A N/A Wound Status: No N/A N/A Wound Recurrence: 1x0.8x0.6 N/A N/A Measurements L x W x D (cm) 0.628 N/A N/A A (cm) : rea 0.377 N/A N/A Volume (cm) : 96.40% N/A N/A % Reduction in A rea: 89.10% N/A N/A % Reduction in Volume: 12 Starting Position 1 (o'clock): 4 Ending Position 1 (o'clock): 1 Maximum Distance 1 (cm): Starting Position 2 (o'clock): Ending Position 2 (o'clock): Yes N/A N/A Undermining: Unstageable/Unclassified N/A N/A Classification: Medium N/A N/A Exudate A mount: Serosanguineous N/A N/A Exudate Type: red, brown N/A N/A Exudate Color: Distinct, outline attached N/A N/A Wound Margin: Large (67-100%) N/A N/A Granulation A  mount: Red, Friable N/A N/A Granulation Quality: Small (1-33%) N/A N/A Necrotic A mount: Fat Layer (Subcutaneous Tissue): Yes N/A N/A Exposed Structures: Bone: Yes Fascia: No [name], [name] ([id number]) [id number] Page 4 of 7 Tendon: No Muscle: No Joint: No Small (1-33%) N/A N/A Epithelialization: No Abnormalities Noted N/A N/A Periwound Skin Texture: Maceration: No N/A N/A Periwound Skin  Moisture: Dry/Scaly: No No Abnormalities Noted N/A N/A Periwound Skin Color: No Abnormality N/A N/A Temperature: Treatment Notes Electronic Signature(s) Signed: 04/04/2022 3:35:26 PM By: Kalman Shan DO Entered By: Kalman Shan on 04/04/2022 14:00:48 -------------------------------------------------------------------------------- Multi-Disciplinary Care Plan Details Patient Name: Date of Service: Maxwell [name], Maxwell [name] 04/04/2022 1:15 PM Medical Record Number: [id number] Patient Account Number: [id number] Date of Birth/Sex: Treating RN: 02/17/1995 (27 y.o. Burnadette Pop, Lauren Primary Care Woodson Macha: [name] Other Clinician: Referring Jemaine Prokop: Treating Dulcie Gammon/Extender: Thereasa Parkin in Treatment: 22 Active Inactive Pressure Nursing Diagnoses: Knowledge deficit related to management of pressures ulcers Goals: Patient will remain free from development of additional pressure ulcers Date Initiated: 10/29/2021 Target Resolution Date: 04/13/2022 Goal Status: Active Interventions: Assess: immobility, friction, shearing, incontinence upon admission and as needed Assess offloading mechanisms upon admission and as needed Assess potential for pressure ulcer upon admission and as needed Provide education on pressure ulcers Notes: Wound/Skin Impairment Nursing Diagnoses: Impaired tissue integrity Knowledge deficit related to ulceration/compromised skin integrity Goals: Patient/caregiver will verbalize understanding of skin  care regimen Date Initiated: 10/29/2021 Target Resolution Date: 04/13/2022 Goal Status: Active Ulcer/skin breakdown will have a volume reduction of 30% by week 4 Date Initiated: 10/29/2021 Target Resolution Date: 04/13/2022 Goal Status: Active Interventions: Assess patient/caregiver ability to obtain necessary supplies Assess patient/caregiver ability to perform ulcer/skin care regimen upon admission and as needed Assess ulceration(s) every visit Provide education on ulcer and skin care Notes: Electronic Signature(s) Signed: 04/05/2022 11:12:19 AM By: Rhae Hammock RN [name] ([id number]) [id number] Page 5 of 7 Entered By: Rhae Hammock on 04/04/2022 13:41:44 -------------------------------------------------------------------------------- Pain Assessment Details Patient Name: Date of Service: [name], [place] 04/04/2022 1:15 PM Medical Record Number: [id number] Patient Account Number: [id number] Date of Birth/Sex: Treating RN: [birth year] (27 y.o. M) Primary Care Miasha Emmons: [name] Other Clinician: Referring Jahson Emanuele: Treating Page Pucciarelli/Extender: Thereasa Parkin in Treatment: 22 Active Problems Location of Pain Severity and Description of Pain Patient Has Paino No Site Locations Pain Management and Medication Current Pain Management: Electronic Signature(s) Signed: 04/08/2022 4:16:21 PM By: Erenest Blank Entered By: Erenest Blank on 04/04/2022 13:38:20 -------------------------------------------------------------------------------- Patient/Caregiver Education Details Patient Name: Date of Service: [name] 3/21/2024andnbsp1:15 PM Medical Record Number: [id number] Patient Account Number: [id number] Date of Birth/Gender: Treating RN: Apr 12, 1995 (26 y.o. Erie Noe Primary Care Physician: [name] Other Clinician: Referring Physician: Treating Physician/Extender: Thereasa Parkin in Treatment: 22 Education Assessment Education Provided To: Patient Education Topics Provided Wound/Skin Impairment: Methods: Explain/Verbal Responses: Reinforcements needed, State content correctly [employer]) Signed: 04/05/2022 11:12:19 AM By: Rhae Hammock RN Entered By: Rhae Hammock on 04/04/2022 13:42:00 [name] ([id number]) [id number] Page 6 of 7 -------------------------------------------------------------------------------- Wound Assessment Details Patient Name: Date of Service: [name], [place] 04/04/2022 1:15 PM Medical Record Number: [id number] Patient Account Number: [id number] Date of Birth/Sex: Treating RN: Aug 05, 1995 (27 y.o. Burnadette Pop, Lauren Primary Care Rohen Kimes: [name] Other Clinician: Referring Sadarius Norman: Treating Nahia Nissan/Extender: Thereasa Parkin in Treatment: 22 Wound Status Wound Number: 1 Primary Etiology: Pressure Ulcer Wound Location: Left Ilium Wound Status: Open Wounding Event: Pressure Injury Comorbid History: Quadriplegia, Seizure Disorder Date Acquired: 10/08/2021 Weeks Of Treatment: 22 Clustered Wound: No Photos Wound Measurements Length: (cm) 1 Width: (cm) 0.8 Depth: (cm) 0.6 Area: (cm) 0.628 Volume: (cm) 0.377 % Reduction in Area: 96.4% % Reduction in Volume: 89.1% Epithelialization: Small (1-33%) Tunneling: No Undermining: Yes Location 1 Starting Position (o'clock): 12 Ending Position (  o'clock): 4 Maximum Distance: (cm) 1 Location 2 Wound Description Classification: Unstageable/Unclassified Wound Margin: Distinct, outline attached Exudate Amount: Medium Exudate Type: Serosanguineous Exudate Color: red, brown Foul Odor After Cleansing: No Slough/Fibrino Yes Wound Bed Granulation Amount: Large (67-100%) Exposed Structure Granulation Quality: Red, Friable Fascia Exposed: No Necrotic Amount: Small  (1-33%) Fat Layer (Subcutaneous Tissue) Exposed: Yes Necrotic Quality: Adherent Slough Tendon Exposed: No Muscle Exposed: No Joint Exposed: No Bone Exposed: Yes Periwound Skin Texture Texture Color No Abnormalities Noted: Yes No Abnormalities Noted: Yes Moisture Temperature / Pain No Abnormalities Noted: No Temperature: No Abnormality Dry / Scaly: No Maceration: No Electronic Signature(s) Signed: 04/05/2022 11:12:19 AM By: Rhae Hammock RN [name] ([id number]) [id number] Page 7 of 7 Signed: 04/08/2022 4:16:21 PM By: Erenest Blank Entered By: Erenest Blank on 04/04/2022 13:46:26 -------------------------------------------------------------------------------- Vitals Details Patient Name: Date of Service: Maxwell [name], Maxwell [name] 04/04/2022 1:15 PM Medical Record Number: [id number] Patient Account Number: [id number] Date of Birth/Sex: Treating RN: 1995/03/12 (27 y.o. M) Primary Care Gal Smolinski: [name] Other Clinician: Referring Milton Streicher: Treating Franchot Pollitt/Extender: Thereasa Parkin in Treatment: 22 Vital Signs Time Taken: 13:36 Temperature (F): 97.5 Respiratory Rate (breaths/min): 17 Reference Range: 80 - 120 mg / dl Notes unable to get b/p d/t pt. moving too much Electronic Signature(s) Signed: 04/08/2022 4:16:21 PM By: Erenest Blank Entered By: Erenest Blank on 04/04/2022 13:39:43

## 2022-04-18 NOTE — Progress Notes (Signed)
This is a Pediatric [facility]ist E-Visit consult/follow up provided via My Chart Video Visit (Caregility). Barbaraann Boys and their parent/guardian Evander Ryce consented to an E-Visit consult today.  Is the patient present for the video visit? Yes Location of patient: Japhet is at home. Is the patient located in the state of [place]? Yes If not in the state of [place], is the location temporary? Ex. vacation or at college? Not Applicable Location of provider: Mil[facility] Obey, [facility] Patient was referred by Georgiann Hahn, MD   This visit was done via VIDEO   Medical Nutrition Therapy - Progress Note Appt start time: 2:20 PM Appt end time: 3:20 PM Reason for referral: Gtube dependence Referring provider: Dr. Moody Bruins - Neuro Pertinent medical hx: Spastic Quadriparesis, Intellectual Disability, Laryngopharyngeal reflux, Epilepsy, Cerebral Palsy, Chromosomal deletion syndrome, dysphagia, +Gtube  Assessment: Food allergies: none noted in Epic  Pertinent Medications: see medication list - Keppra, Prevacid Vitamins/Supplements: vitamin C (unsure of amount), zinc (unsure of amount), vitamin D3 (unsure of amount) Pertinent labs:  (11/2) CBC: Hemoglobin - 12.8 (low)  No anthropometrics taken on 4/18 due to virtual visit. Most recent anthropometrics 11/29 were used to determine dietary needs.    (11/29) Anthropometrics: Ht: 162.5 cm     Wt: 45.4 kg       BMI: 17.1         05/02/22 Wt: 43.1 kg 12/12/21 Wt: 45.4 kg, BMI: 17.1  07/11/21 Wt: 45.4 kg; BMI: 17.1 05/31/21 Wt: 45.4 kg; BMI: 17.1 01/25/21 Wt: 45.4 kg; BMI: 17.1  12/21/20 Wt: 45.4 kg; BMI: 17.1  Estimated minimum caloric needs: 2000 kcal/day (wt loss with current regimen)  Estimated minimum protein needs: 1.5-2.0 g/day (DRI and clinical judgement) Estimated minimum fluid needs: 1800-2000 mL/day (1 mL/kcal)   Primary concerns today: Follow-up for gtube dependence. Mom  accompanied pt to appt today.   Dietary Intake Hx: DME: [facility], fax: [phone]  Formula: Liquid Hope Peptide Current regimen:  Day feeds: 12 oz (1 pouch) @ ~360-430 mL/hr x 4 feeds (8 AM, 12 PM, 4 PM 8 PM) Overnight feeds: none Total Volume: ~4 pouches total   FWF: 50-60 mL after each feed and meds (x4); 150-200 mL x3 bolus, 800 mL averaging during the day  Nutritional Supplements: 2 pack Juven (juven added ~1:30 PM water flush)   Position during feeds: upright in chair (stays upright for at least a few hours after feed to aid in reflux) PO foods/beverages: tastes occasionally  Notes: Mom reports Jamile's wound continues to heal well and is getting smaller. Brayam's femur is fully healed.  GI: severe constipation since accident (every few days), previously daily - 1/2 capful of Miralax daily (unable to go without Miralax), laxative PRN  GU: 4-5+/day (very saturated)   Physical Activity: wheelchair bound  Estimated caloric intake: 2000 kcal/day - meets 100% of estimated needs Estimated protein intake: 2.2 g/kg/day - meets 110-146% of estimated needs Estimated fluid intake: 1816 mL/day - meets 91-100% of estimated needs  Micronutrient Intake  Vitamin A 1036 mcg  Vitamin C 612 mg  Vitamin D 20 mcg  Vitamin E 16.1 mg  Vitamin K 167.2 mcg  Vitamin B1 (thiamin) 1.6 mg  Vitamin B2 (riboflavin) 1.7 mg  Vitamin B3 (niacin) 25.4 mg  Vitamin B5 (pantothenic acid) 6.6 mg  Vitamin B6 2.3 mg  Vitamin B7 (biotin) 260 mcg  Vitamin B9 (folate) 444 mcg  Vitamin B12 6 mcg  Choline 589.2 mg  Calcium 1400 mg  Chromium 40 mcg  Copper 1440 mcg  Fluoride 0 mg  Iodine 208 mcg  Iron 28.8 mg  Magnesium 629.6 mg  Manganese 4.8 mg  Molybdenum 0 mcg  Phosphorous 905.6 mg  Selenium 69.6 mcg  Zinc 16 mg  Potassium 4240 mg  Sodium 2160 mg  Chloride 0 mg  Fiber 44 g   Nutrition Diagnosis: (1/12) Inadequate oral intake related to medical condition as evidenced by pt dependent on Gtube  feedings to meet nutritional needs.  Intervention: Discussed pt's current regimen. Discussed recommendations below. All questions answered, family in agreement with plan.   Nutrition Recommendations sent via MyChart message: - Look into cooling sheets and cooling pillows to see if this helps with Stiles's temperature regulation.  - Feel free to start trying 1 oz of prune of juice through Ohm's tube daily if you'd like. I would recommend starting a stool journal (keep up with how large the stool is,  - Continue current regimen. Reach out to me if Riely goes over 100 pounds and we will decrease the amount he's getting.  - Continue 2 Juven per day while we're working on his wound healing. We will stop this once his wound fully heals.   Teach back method used.  Monitoring/Evaluation: Continue to Monitor: - Weight trends  - TF tolerance   Follow-up scheduled for August 29th @ 1:30 PM, joint with Elveria Rising, NP.  Total time spent in counseling: 60 minutes.

## 2022-04-25 ENCOUNTER — Encounter (HOSPITAL_BASED_OUTPATIENT_CLINIC_OR_DEPARTMENT_OTHER): Payer: BC Managed Care – PPO | Attending: Internal Medicine | Admitting: Internal Medicine

## 2022-04-25 DIAGNOSIS — M4628 Osteomyelitis of vertebra, sacral and sacrococcygeal region: Secondary | ICD-10-CM | POA: Diagnosis not present

## 2022-04-25 DIAGNOSIS — L89320 Pressure ulcer of left buttock, unstageable: Secondary | ICD-10-CM

## 2022-04-25 DIAGNOSIS — G809 Cerebral palsy, unspecified: Secondary | ICD-10-CM
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
## 2022-04-26 NOTE — Progress Notes (Signed)
SHELVIN, DRESS ([id number]) [phone].pdf Page 1 of 7 Visit Report for 04/25/2022 Arrival Information Details Patient Name: Date of Service: [name], [place] 04/25/2022 1:30 PM Medical Record Number: [id number] Patient Account Number: [id number] Date of Birth/Sex: Treating RN: January 29, 1995 (26 y.o. M) Primary Care Farren Nelles: Georgiann Hahn Other Clinician: Referring Latrell Reitan: Treating Laurey Salser/Extender: Jonelle Sidle in Treatment: 25 Visit Information History Since Last Visit Added or deleted any medications: No Patient Arrived: Wheel Chair Any new allergies or adverse reactions: No Arrival Time: 13:46 Had a fall or experienced change in No Accompanied By: parents activities of daily living that may affect Transfer Assistance: Manual risk of falls: Patient Identification Verified: Yes Signs or symptoms of abuse/neglect since last visito No Secondary Verification Process Completed: Yes Hospitalized since last visit: No Patient Requires Transmission-Based Precautions: No Implantable device outside of the clinic excluding No Patient Has Alerts: No cellular tissue based products placed in the center since last visit: Has Dressing in Place as Prescribed: Yes Pain Present Now: No Electronic Signature(s) Signed: 04/26/2022 12:27:54 PM By: Thayer Dallas Entered By: Thayer Dallas on 04/25/2022 13:46:34 -------------------------------------------------------------------------------- Clinic Level of Care Assessment Details Patient Name: Date of Service: [name], [place] 04/25/2022 1:30 PM Medical Record Number: [id number] Patient Account Number: [id number] Date of Birth/Sex: Treating RN: 1995-03-05 (26 y.o. Cline Cools Primary Care Zamzam Whinery: Georgiann Hahn Other Clinician: Referring Kyrollos Cordell: Treating Merryn Thaker/Extender: Jonelle Sidle in Treatment: 25 Clinic Level of Care Assessment Items TOOL 4  Quantity Score X- 1 0 Use when only an EandM is performed on FOLLOW-UP visit ASSESSMENTS - Nursing Assessment / Reassessment X- 1 10 Reassessment of Co-morbidities (includes updates in patient status) X- 1 5 Reassessment of Adherence to Treatment Plan ASSESSMENTS - Wound and Skin A ssessment / Reassessment X - Simple Wound Assessment / Reassessment - one wound 1 5 []  - 0 Complex Wound Assessment / Reassessment - multiple wounds []  - 0 Dermatologic / Skin Assessment (not related to wound area) ASSESSMENTS - Focused Assessment []  - 0 Circumferential Edema Measurements - multi extremities []  - 0 Nutritional Assessment / Counseling / Intervention []  - 0 Lower Extremity Assessment (monofilament, tuning fork, pulses) []  - 0 Peripheral Arterial Disease Assessment (using hand held doppler) ASSESSMENTS - Ostomy and/or Continence Assessment and Care []  - 0 Incontinence Assessment and Management []  - 0 Ostomy Care Assessment and Management (repouching, etc.) PROCESS - Coordination of Care X - Simple Patient / Family Education for ongoing care 1 15 CARA, PAYMENT ([id number]) [id number] Page 2 of 7 []  - 0 Complex (extensive) Patient / Family Education for ongoing care X- 1 10 Staff obtains Chiropractor, Records, T Results / Process Orders est []  - 0 Staff telephones HHA, Nursing Homes / Clarify orders / etc []  - 0 Routine Transfer to another Facility (non-emergent condition) []  - 0 Routine Hospital Admission (non-emergent condition) []  - 0 New Admissions / Manufacturing engineer / Ordering NPWT Apligraf, etc. , []  - 0 Emergency Hospital Admission (emergent condition) []  - 0 Simple Discharge Coordination []  - 0 Complex (extensive) Discharge Coordination PROCESS - Special Needs []  - 0 Pediatric / Minor Patient Management []  - 0 Isolation Patient Management []  - 0 Hearing / Language / Visual special needs []  - 0 Assessment of Community assistance  (transportation, D/C planning, etc.) []  - 0 Additional assistance / Altered mentation []  - 0 Support Surface(s) Assessment (bed, cushion, seat, etc.) INTERVENTIONS - Wound Cleansing / Measurement X - Simple Wound Cleansing -  one wound 1 5  - 0 Complex Wound Cleansing - multiple wounds X- 1 5 Wound Imaging (photographs - any number of wounds)  - 0 Wound Tracing (instead of photographs) X- 1 5 Simple Wound Measurement - one wound  - 0 Complex Wound Measurement - multiple wounds INTERVENTIONS - Wound Dressings X - Small Wound Dressing one or multiple wounds 1 10  - 0 Medium Wound Dressing one or multiple wounds  - 0 Large Wound Dressing one or multiple wounds  - 0 Application of Medications - topical  - 0 Application of Medications - injection INTERVENTIONS - Miscellaneous  - 0 External ear exam  - 0 Specimen Collection (cultures, biopsies, blood, body fluids, etc.)  - 0 Specimen(s) / Culture(s) sent or taken to Lab for analysis  - 0 Patient Transfer (multiple staff / Nurse, adult / Similar devices)  - 0 Simple Staple / Suture removal (25 or less)  - 0 Complex Staple / Suture removal (26 or more)  - 0 Hypo / Hyperglycemic Management (close monitor of Blood Glucose)  - 0 Ankle / Brachial Index (ABI) - do not check if billed separately X- 1 5 Vital Signs Has the patient been seen at the hospital within the last three years: Yes Total Score: 75 Level Of Care: New/Established - Level 2 Electronic Signature(s) Signed: 04/25/2022 4:44:09 PM By: Redmond Pulling RN, BSN Entered By: Redmond Pulling on 04/25/2022 16:33:14 Lodema Hong ([id number]) [id number] Page 3 of 7 -------------------------------------------------------------------------------- Encounter Discharge Information Details Patient Name: Date of Service: [name], [place] 04/25/2022 1:30 PM Medical Record Number: [id number] Patient Account Number:  [id number] Date of Birth/Sex: Treating RN: Oct 13, 1995 (26 y.o. Cline Cools Primary Care Skeeter Sheard: Georgiann Hahn Other Clinician: Referring Eldin Bonsell: Treating Ellan Tess/Extender: Jonelle Sidle in Treatment: 25 Encounter Discharge Information Items Discharge Condition: Stable Ambulatory Status: Wheelchair Discharge Destination: Home Transportation: Private Auto Accompanied By: parents Schedule Follow-up Appointment: Yes Clinical Summary of Care: Patient Declined Electronic Signature(s) Signed: 04/25/2022 4:44:09 PM By: Redmond Pulling RN, BSN Entered By: Redmond Pulling on 04/25/2022 16:34:35 -------------------------------------------------------------------------------- Lower Extremity Assessment Details Patient Name: Date of Service: Maxwell Laster, [name] 04/25/2022 1:30 PM Medical Record Number: [id number] Patient Account Number: [id number] Date of Birth/Sex: Treating RN: [birth year] (26 y.o. M) Primary Care Jovana Rembold: Georgiann Hahn Other Clinician: Referring Janeal Abadi: Treating Keiasha Diep/Extender: Jonelle Sidle in Treatment: 25 Electronic Signature(s) Signed: 04/26/2022 12:27:54 PM By: Thayer Dallas Entered By: Thayer Dallas on 04/25/2022 13:50:05 -------------------------------------------------------------------------------- Multi Wound Chart Details Patient Name: Date of Service: Maxwell Laster, [name] 04/25/2022 1:30 PM Medical Record Number: [id number] Patient Account Number: [id number] Date of Birth/Sex: Treating RN: 1995/11/20 (26 y.o. M) Primary Care Marcellene Shivley: Georgiann Hahn Other Clinician: Referring Maleny Candy: Treating Zala Degrasse/Extender: Jonelle Sidle in Treatment: 25 Vital Signs Height(in): Pulse(bpm): 120 Weight(lbs): 95.1 Blood Pressure(mmHg): 104/52 Body Mass Index(BMI): Temperature(F): 97.8 Respiratory Rate(breaths/min): 16 [1:Photos:] [N/A:N/A] [name], [name]  ([id number]) [1:Left Ilium Wound Location: Pressure Injury Wounding Event: Pressure Ulcer Primary Etiology: Quadriplegia, Seizure Disorder Comorbid History: 10/08/2021 Date Acquired: 25 Weeks of Treatment: Open Wound Status: No Wound Recurrence: 0.4x0.3x0.8  Measurements L x W x D (cm) 0.094 A (cm) : rea 0.075 Volume (cm) : 99.50% % Reduction in A rea: 97.80% % Reduction in Volume: 12 Starting Position 1 (o'clock): 6 Ending Position 1 (o'clock): 1.5 Maximum Distance 1 (cm): Yes Undermining:  Unstageable/Unclassified Classification: Medium Exudate A mount: Serosanguineous Exudate Type: red, brown Exudate Color: Distinct, outline attached Wound Margin:  Large (67-100%) Granulation A mount: Red, Friable Granulation Quality: Small (1-33%)  Necrotic A mount: Fat Layer (Subcutaneous Tissue): Yes N/A Exposed Structures: Bone: Yes Fascia: No Tendon: No Muscle: No Joint: No Large (67-100%) Epithelialization: No Abnormalities Noted Periwound Skin Texture: Maceration: No Periwound Skin Moisture:  Dry/Scaly: No No Abnormalities Noted Periwound Skin Color: No Abnormality Temperature:] [N/A:N/A N/A N/A N/A N/A N/A N/A N/A N/A N/A N/A N/A N/A N/A N/A N/A N/A N/A N/A N/A N/A N/A N/A N/A N/A N/A N/A] Treatment Notes Electronic Signature(s) Signed: 04/25/2022 3:28:17 PM By: Geralyn Corwin DO Entered By: Geralyn Corwin on 04/25/2022 14:53:15 -------------------------------------------------------------------------------- Multi-Disciplinary Care Plan Details Patient Name: Date of Service: Maxwell Laster, [name] 04/25/2022 1:30 PM Medical Record Number: [id number] Patient Account Number: [id number] Date of Birth/Sex: Treating RN: Jun 09, 1995 (26 y.o. Cline Cools Primary Care Adalyna Godbee: Georgiann Hahn Other Clinician: Referring Maylene Crocker: Treating Delbert Vu/Extender: Jonelle Sidle in Treatment: 25 Active Inactive Wound/Skin Impairment Nursing Diagnoses: Impaired tissue  integrity Knowledge deficit related to ulceration/compromised skin integrity Goals: Patient/caregiver will verbalize understanding of skin care regimen Date Initiated: 10/29/2021 Target Resolution Date: 06/07/2022 Goal Status: Active Ulcer/skin breakdown will have a volume reduction of 30% by week 4 Date Initiated: 10/29/2021 Target Resolution Date: 04/13/2022 Goal Status: Active Interventions: Assess patient/caregiver ability to obtain necessary supplies Assess patient/caregiver ability to perform ulcer/skin care regimen upon admission and as needed Assess ulceration(s) every visit [name], [name] ([id number]) [phone].pdf Page 5 of 7 Provide education on ulcer and skin care Notes: Electronic Signature(s) Signed: 04/25/2022 4:44:09 PM By: Redmond Pulling RN, BSN Entered By: Redmond Pulling on 04/25/2022 13:59:32 -------------------------------------------------------------------------------- Pain Assessment Details Patient Name: Date of Service: Maxwell Laster, [name] 04/25/2022 1:30 PM Medical Record Number: [id number] Patient Account Number: [id number] Date of Birth/Sex: Treating RN: 1995/03/14 (26 y.o. M) Primary Care Rivers Hamrick: Georgiann Hahn Other Clinician: Referring Rakin Lemelle: Treating Venesha Petraitis/Extender: Jonelle Sidle in Treatment: 25 Active Problems Location of Pain Severity and Description of Pain Patient Has Paino No Site Locations Pain Management and Medication Current Pain Management: Electronic Signature(s) Signed: 04/26/2022 12:27:54 PM By: Thayer Dallas Entered By: Thayer Dallas on 04/25/2022 13:49:56 -------------------------------------------------------------------------------- Patient/Caregiver Education Details Patient Name: Date of Service: [name] 4/11/2024andnbsp1:30 PM Medical Record Number: [id number] Patient Account Number: [id number] Date of Birth/Gender: Treating RN: 1995/06/21 (26 y.o. Cline Cools Primary Care Physician: Georgiann Hahn Other Clinician: Referring Physician: Treating Physician/Extender: Jonelle Sidle in Treatment: 25 Education Assessment Education Provided To: Patient Education Topics Provided Wound/Skin Impairment: Methods: Explain/Verbal Responses: State content correctly [name], [name] ([id number]) [id number] Page 6 of 7 Electronic Signature(s) Signed: 04/25/2022 4:44:09 PM By: Redmond Pulling RN, BSN Entered By: Redmond Pulling on 04/25/2022 13:59:58 -------------------------------------------------------------------------------- Wound Assessment Details Patient Name: Date of Service: Maxwell Laster, [name] 04/25/2022 1:30 PM Medical Record Number: [id number] Patient Account Number: [id number] Date of Birth/Sex: Treating RN: Feb 02, 1995 (26 y.o. M) Primary Care Taina Landry: Georgiann Hahn Other Clinician: Referring Laramie Meissner: Treating Annise Boran/Extender: Jonelle Sidle in Treatment: 25 Wound Status Wound Number: 1 Primary Etiology: Pressure Ulcer Wound Location: Left Ilium Wound Status: Open Wounding Event: Pressure Injury Comorbid History: Quadriplegia, Seizure Disorder Date Acquired: 10/08/2021 Weeks Of Treatment: 25 Clustered Wound: No Photos Wound Measurements Length: (cm) 0.4 Width: (cm) 0.3 Depth: (cm) 0.8 Area: (cm) 0.094 Volume: (cm) 0.075 % Reduction in Area: 99.5% % Reduction in Volume: 97.8% Epithelialization: Large (67-100%) Undermining: Yes Starting Position (o'clock): 12 Ending Position (o'clock): 6 Maximum  Distance: (cm) 1.5 Wound Description Classification: Unstageable/Unclassified Wound Margin: Distinct, outline attached Exudate Amount: Medium Exudate Type: Serosanguineous Exudate Color: red, brown Foul Odor After Cleansing: No Slough/Fibrino Yes Wound Bed Granulation Amount: Large (67-100%) Exposed Structure Granulation Quality:  Red, Friable Fascia Exposed: No Necrotic Amount: Small (1-33%) Fat Layer (Subcutaneous Tissue) Exposed: Yes Necrotic Quality: Adherent Slough Tendon Exposed: No Muscle Exposed: No Joint Exposed: No Bone Exposed: Yes Periwound Skin Texture Texture Color No Abnormalities Noted: Yes No Abnormalities Noted: Yes Moisture Temperature / Pain No Abnormalities Noted: No Temperature: No Abnormality Dry / Scaly: No Maceration: No [name], [name] ([id number]) [phone].pdf Page 7 of 7 Treatment Notes Wound #1 (Ilium) Wound Laterality: Left Cleanser Normal Saline Discharge Instruction: Cleanse the wound with Normal Saline prior to applying a clean dressing using gauze sponges, not tissue or cotton balls. Wound Cleanser Discharge Instruction: Cleanse the wound with wound cleanser prior to applying a clean dressing using gauze sponges, not tissue or cotton balls. Peri-Wound Care Skin Prep Discharge Instruction: Use skin prep as directed Topical Primary Dressing Dakin's Solution 0.25%, 16 (oz) Discharge Instruction: Moisten gauze with Dakin's solution Promogran Prisma Matrix, 4.34 (sq in) (silver collagen) Discharge Instruction: Moisten collagen with saline or hydrogel Plain packing strip 1/2 (in) Discharge Instruction: Lightly pack as instructed Secondary Dressing Zetuvit Plus Silicone Border Dressing 5x5 (in/in) Discharge Instruction: Apply silicone border over primary dressing as directed. Secured With 25M Medipore H Soft Cloth Surgical T ape, 4 x 10 (in/yd) Discharge Instruction: Secure with tape as directed. Compression Wrap Compression Stockings Add-Ons Electronic Signature(s) Signed: 04/26/2022 12:27:54 PM By: Thayer Dallas Entered By: Thayer Dallas on 04/25/2022 13:53:03 -------------------------------------------------------------------------------- Vitals Details Patient Name: Date of Service: Maxwell Laster, [name] 04/25/2022 1:30 PM Medical Record  Number: [id number] Patient Account Number: [id number] Date of Birth/Sex: Treating RN: 1995/08/18 (26 y.o. M) Primary Care Mackenzie Groom: Georgiann Hahn Other Clinician: Referring Marlow Berenguer: Treating Gareld Obrecht/Extender: Jonelle Sidle in Treatment: 25 Vital Signs Time Taken: 13:45 Temperature (F): 97.8 Weight (lbs): 95.1 Pulse (bpm): 120 Source: Stated Respiratory Rate (breaths/min): 16 Blood Pressure (mmHg): 104/52 Reference Range: 80 - 120 mg / dl Electronic Signature(s) Signed: 04/26/2022 12:27:54 PM By: Thayer Dallas Entered By: Thayer Dallas on 04/25/2022 13:45:57

## 2022-05-02 ENCOUNTER — Encounter (INDEPENDENT_AMBULATORY_CARE_PROVIDER_SITE_OTHER): Payer: Self-pay

## 2022-05-02 ENCOUNTER — Ambulatory Visit (INDEPENDENT_AMBULATORY_CARE_PROVIDER_SITE_OTHER): Payer: BC Managed Care – PPO | Admitting: Dietician

## 2022-05-02 VITALS — Wt 95.1 lb

## 2022-05-02 DIAGNOSIS — R638 Other symptoms and signs concerning food and fluid intake: Secondary | ICD-10-CM

## 2022-05-02 DIAGNOSIS — Z931 Gastrostomy status: Secondary | ICD-10-CM

## 2022-05-02 NOTE — Patient Instructions (Signed)
Nutrition Recommendations sent via MyChart message: - Look into cooling sheets and cooling pillows to see if this helps with [name]'s temperature regulation.  - Feel free to start trying 1 oz of prune of juice through [name]'s tube daily if you'd like. I would recommend starting a stool journal (keep up with how large the stool is,  - Continue current regimen. Reach out to me if [name] goes over 100 pounds and we will decrease the amount he's getting.  - Continue 2 Juven per day while we're working on his wound healing. We will stop this once his wound fully heals.

## 2022-05-09 ENCOUNTER — Other Ambulatory Visit (INDEPENDENT_AMBULATORY_CARE_PROVIDER_SITE_OTHER): Payer: Self-pay | Admitting: Family

## 2022-05-09 DIAGNOSIS — G40309 Generalized idiopathic epilepsy and epileptic syndromes, not intractable, without status epilepticus: Secondary | ICD-10-CM

## 2022-05-23 ENCOUNTER — Encounter (HOSPITAL_BASED_OUTPATIENT_CLINIC_OR_DEPARTMENT_OTHER): Payer: BC Managed Care – PPO | Attending: Internal Medicine | Admitting: Internal Medicine

## 2022-05-23 DIAGNOSIS — G809 Cerebral palsy, unspecified: Secondary | ICD-10-CM | POA: Insufficient documentation

## 2022-05-23 DIAGNOSIS — M4628 Osteomyelitis of vertebra, sacral and sacrococcygeal region: Secondary | ICD-10-CM | POA: Diagnosis not present

## 2022-05-23 DIAGNOSIS — G40909 Epilepsy, unspecified, not intractable, without status epilepticus: Secondary | ICD-10-CM | POA: Diagnosis not present

## 2022-05-23 DIAGNOSIS — L89320 Pressure ulcer of left buttock, unstageable: Secondary | ICD-10-CM | POA: Insufficient documentation
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
## 2022-06-20 ENCOUNTER — Encounter (HOSPITAL_BASED_OUTPATIENT_CLINIC_OR_DEPARTMENT_OTHER): Payer: BC Managed Care – PPO | Attending: Internal Medicine | Admitting: Internal Medicine

## 2022-06-20 DIAGNOSIS — G40909 Epilepsy, unspecified, not intractable, without status epilepticus: Secondary | ICD-10-CM | POA: Insufficient documentation

## 2022-06-20 DIAGNOSIS — G8250 Quadriplegia, unspecified: Secondary | ICD-10-CM | POA: Diagnosis not present

## 2022-06-20 DIAGNOSIS — Z931 Gastrostomy status: Secondary | ICD-10-CM | POA: Diagnosis not present

## 2022-06-20 DIAGNOSIS — L89320 Pressure ulcer of left buttock, unstageable: Secondary | ICD-10-CM | POA: Diagnosis present

## 2022-06-20 DIAGNOSIS — Z833 Family history of diabetes mellitus: Secondary | ICD-10-CM | POA: Insufficient documentation

## 2022-06-20 DIAGNOSIS — K219 Gastro-esophageal reflux disease without esophagitis: Secondary | ICD-10-CM | POA: Diagnosis not present

## 2022-06-20 DIAGNOSIS — G809 Cerebral palsy, unspecified: Secondary | ICD-10-CM | POA: Diagnosis not present

## 2022-07-04 ENCOUNTER — Encounter (HOSPITAL_BASED_OUTPATIENT_CLINIC_OR_DEPARTMENT_OTHER): Payer: BC Managed Care – PPO | Admitting: Internal Medicine

## 2022-07-04 DIAGNOSIS — M4628 Osteomyelitis of vertebra, sacral and sacrococcygeal region: Secondary | ICD-10-CM

## 2022-07-04 DIAGNOSIS — L89320 Pressure ulcer of left buttock, unstageable: Secondary | ICD-10-CM | POA: Diagnosis not present

## 2022-07-04 DIAGNOSIS — G809 Cerebral palsy, unspecified: Secondary | ICD-10-CM
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
## 2022-07-05 NOTE — Progress Notes (Signed)
[name] ([id number]) [id number] Page 1 of 8 Visit Report for Rivers Arrival Information Details Patient Name: Date of Service: [name], [place] Rivers 1:30 PM Medical Record Number: [id number] Patient Account Number: [id number] Date of Birth/Sex: Treating RN: 08/04/1995 (26 y.o. M) Primary Care [name]: [name] Other Clinician: Referring [name]: Treating [name]: [name] in Treatment: 35 Visit Information History Since Last Visit Added or deleted any medications: No Patient Arrived: Wheel Chair Any new allergies or adverse reactions: No Arrival Time: 13:51 Had a fall or experienced change in No Accompanied By: parents activities of daily living that may affect Transfer Assistance: Manual risk of falls: Patient Identification Verified: Yes Signs or symptoms of abuse/neglect since last visito No Secondary Verification Process Completed: Yes Hospitalized since last visit: No Patient Requires Transmission-Based Precautions: No Implantable device outside of the clinic excluding No Patient Has Alerts: No cellular tissue based products placed in the center since last visit: Has Dressing in Place as Prescribed: Yes Pain Present Now: Unable to Respond Electronic Signature(s) Signed: 07/05/2022 2:31:37 PM By: Thayer Dallas Entered By: Thayer Dallas on 07/04/2022 13:56:35 -------------------------------------------------------------------------------- Clinic Level of Care Assessment Details Patient Name: Date of Service: [name], [place] Rivers 1:30 PM Medical Record Number: [id number] Patient Account Number: [id number] Date of Birth/Sex: Treating RN: 1995-06-11 (26 y.o. [name] Primary Care [name]: [name] Other Clinician: Referring [name]: Treating [name]/Extender: [name] in Treatment: 35 Clinic Level of Care Assessment  Items TOOL 4 Quantity Score X- 1 0 Use when only an EandM is performed on FOLLOW-UP visit ASSESSMENTS - Nursing Assessment / Reassessment X- 1 10 Reassessment of Co-morbidities (includes updates in patient status) X- 1 5 Reassessment of Adherence to Treatment Plan ASSESSMENTS - Wound and Skin A ssessment / Reassessment []  - 0 Simple Wound Assessment / Reassessment - one wound X- 1 5 Complex Wound Assessment / Reassessment - multiple wounds X- 1 10 Dermatologic / Skin Assessment (not related to wound area) ASSESSMENTS - Focused Assessment []  - 0 Circumferential Edema Measurements - multi extremities X- 1 10 Nutritional Assessment / Counseling / Intervention [name] ([id number]) [id number] Page 2 of 8 []  - 0 Lower Extremity Assessment (monofilament, tuning fork, pulses) []  - 0 Peripheral Arterial Disease Assessment (using hand held doppler) ASSESSMENTS - Ostomy and/or Continence Assessment and Care []  - 0 Incontinence Assessment and Management []  - 0 Ostomy Care Assessment and Management (repouching, etc.) PROCESS - Coordination of Care []  - 0 Simple Patient / Family Education for ongoing care X- 1 20 Complex (extensive) Patient / Family Education for ongoing care X- 1 10 Staff obtains Chiropractor, Records, T Results / Process Orders est []  - 0 Staff telephones HHA, Nursing Homes / Clarify orders / etc []  - 0 Routine Transfer to another Facility (non-emergent condition) []  - 0 Routine Hospital Admission (non-emergent condition) []  - 0 New Admissions / Manufacturing engineer / Ordering NPWT Apligraf, etc. , []  - 0 Emergency Hospital Admission (emergent condition) []  - 0 Simple Discharge Coordination X- 1 15 Complex (extensive) Discharge Coordination PROCESS - Special Needs []  - 0 Pediatric / Minor Patient Management []  - 0 Isolation Patient Management []  - 0 Hearing / Language / Visual special needs []  - 0 Assessment of  Community assistance (transportation, D/C planning, etc.) []  - 0 Additional assistance / Altered mentation []  - 0 Support Surface(s) Assessment (bed, cushion, seat, etc.) INTERVENTIONS - Wound Cleansing / Measurement X - Simple Wound Cleansing -  one wound 1 5 []  - 0 Complex Wound Cleansing - multiple wounds X- 1 5 Wound Imaging (photographs - any number of wounds) []  - 0 Wound Tracing (instead of photographs) X- 1 5 Simple Wound Measurement - one wound []  - 0 Complex Wound Measurement - multiple wounds INTERVENTIONS - Wound Dressings X - Small Wound Dressing one or multiple wounds 1 10 []  - 0 Medium Wound Dressing one or multiple wounds []  - 0 Large Wound Dressing one or multiple wounds []  - 0 Application of Medications - topical []  - 0 Application of Medications - injection INTERVENTIONS - Miscellaneous []  - 0 External ear exam []  - 0 Specimen Collection (cultures, biopsies, blood, body fluids, etc.) []  - 0 Specimen(s) / Culture(s) sent or taken to Lab for analysis []  - 0 Patient Transfer (multiple staff / Nurse, adult / Similar devices) []  - 0 Simple Staple / Suture removal (25 or less) []  - 0 Complex Staple / Suture removal (26 or more) []  - 0 Hypo / Hyperglycemic Management (close monitor of Blood Glucose) [name] ([id number]) [id number] Page 3 of 8 []  - 0 Ankle / Brachial Index (ABI) - do not check if billed separately X- 1 5 Vital Signs Has the patient been seen at the hospital within the last three years: Yes Total Score: 115 Level Of Care: New/Established - Level 3 Electronic Signature(s) Signed: 07/04/2022 6:06:31 PM By: Maxwell Stall RN, BSN Entered By: [name] on 07/04/2022 14:26:36 -------------------------------------------------------------------------------- Encounter Discharge Information Details Patient Name: Date of Service: Maxwell Laster, MA [name] 1:30 PM Medical Record Number: [id number] Patient  Account Number: [id number] Date of Birth/Sex: Treating RN: September 01, 1995 (26 y.o. [name] Primary Care [name]: [name] Other Clinician: Referring [name]: Treating [name]/Extender: [name] in Treatment: 12 Encounter Discharge Information Items Discharge Condition: Stable Ambulatory Status: Wheelchair Discharge Destination: Home Transportation: Private Auto Accompanied By: mother Schedule Follow-up Appointment: Yes Clinical Summary of Care: Electronic Signature(s) Signed: 07/04/2022 6:06:31 PM By: Maxwell Stall RN, BSN Entered By: [name] on 07/04/2022 14:27:03 -------------------------------------------------------------------------------- Lower Extremity Assessment Details Patient Name: Date of Service: [name], [name] Rivers 1:30 PM Medical Record Number: [id number] Patient Account Number: [id number] Date of Birth/Sex: Treating RN: 18-Jan-1995 (26 y.o. M) Primary Care Meiling Hendriks: [name] Other Clinician: Referring Lehman Whiteley: Treating Marc Sivertsen/Extender: [name] in Treatment: 35 Electronic Signature(s) Signed: 07/05/2022 2:31:37 PM By: Thayer Dallas Entered By: Thayer Dallas on 07/04/2022 13:56:43 -------------------------------------------------------------------------------- Multi Wound Chart Details Patient Name: Date of Service: Maxwell Laster, MA [name] 1:30 PM Medical Record Number: [id number] Patient Account Number: [id number] KERY, BATZEL ([id number]) [id number] Page 4 of 8 Date of Birth/Sex: Treating RN: April 23, 1995 (26 y.o. M) Primary Care Myleen Brailsford: Other Clinician: Georgiann Rivers Referring Deirdra Heumann: Treating Dorcus Riga/Extender: [name] in Treatment: 35 Vital Signs Height(in): Pulse(bpm): 98 Weight(lbs): 95.1 Blood Pressure(mmHg): 111/75 Body Mass Index(BMI): Temperature(F): 97.9 Respiratory  Rate(breaths/min): 18 [1:Photos:] [N/A:N/A] Left Ilium N/A N/A Wound Location: Pressure Injury N/A N/A Wounding Event: Pressure Ulcer N/A N/A Primary Etiology: Quadriplegia, Seizure Disorder N/A N/A Comorbid History: 10/08/2021 N/A N/A Date Acquired: 11 N/A N/A Weeks of Treatment: Open N/A N/A Wound Status: No N/A N/A Wound Recurrence: 0.4x0.1x0.9 N/A N/A Measurements L x W x D (cm) 0.031 N/A N/A A (cm) : rea 0.028 N/A N/A Volume (cm) : 99.80% N/A N/A % Reduction in A rea: 99.20% N/A N/A % Reduction in Volume: 1 Position 1 (o'clock): 0.7 Maximum Distance 1 (  cm): Yes N/A N/A Tunneling: Unstageable/Unclassified N/A N/A Classification: Medium N/A N/A Exudate A mount: Serosanguineous N/A N/A Exudate Type: red, brown N/A N/A Exudate Color: Distinct, outline attached N/A N/A Wound Margin: Large (67-100%) N/A N/A Granulation A mount: Red N/A N/A Granulation Quality: None Present (0%) N/A N/A Necrotic A mount: Fat Layer (Subcutaneous Tissue): Yes N/A N/A Exposed Structures: Bone: Yes Fascia: No Tendon: No Muscle: No Joint: No Large (67-100%) N/A N/A Epithelialization: No Abnormalities Noted N/A N/A Periwound Skin Texture: Maceration: No N/A N/A Periwound Skin Moisture: Dry/Scaly: No No Abnormalities Noted N/A N/A Periwound Skin Color: No Abnormality N/A N/A Temperature: Treatment Notes Wound #1 (Ilium) Wound Laterality: Left Cleanser Normal Saline Discharge Instruction: Cleanse the wound with Normal Saline prior to applying a clean dressing using gauze sponges, not tissue or cotton balls. Wound Cleanser Discharge Instruction: Cleanse the wound with wound cleanser prior to applying a clean dressing using gauze sponges, not tissue or cotton balls. Peri-Wound Care Topical Primary Dressing Hydrofera Blue Classic Foam Rope Dressing, 9x6 (mm/in) Discharge Instruction: Moisten with saline prior to packing ROE, WILNER ([id number])  [id number] Page 5 of 8 Secondary Dressing Zetuvit Plus Silicone Border Dressing 4x4 (in/in) Discharge Instruction: Apply silicone border over primary dressing as directed. Secured With 45M Medipore H Soft Cloth Surgical T ape, 4 x 10 (in/yd) Discharge Instruction: Secure with tape as directed. Compression Wrap Compression Stockings Add-Ons Electronic Signature(s) Signed: 07/04/2022 4:07:01 PM By: Geralyn Corwin DO Entered By: Geralyn Corwin on 07/04/2022 14:28:04 -------------------------------------------------------------------------------- Multi-Disciplinary Care Plan Details Patient Name: Date of Service: Maxwell Laster, MA [name] 1:30 PM Medical Record Number: [id number] Patient Account Number: [id number] Date of Birth/Sex: Treating RN: [birth year] (26 y.o. [name] Primary Care Kortland Nichols: [name] Other Clinician: Referring Rithik Odea: Treating Driana Dazey/Extender: [name] in Treatment: 35 Active Inactive Wound/Skin Impairment Nursing Diagnoses: Impaired tissue integrity Knowledge deficit related to ulceration/compromised skin integrity Goals: Patient/caregiver will verbalize understanding of skin care regimen Date Initiated: 10/29/2021 Target Resolution Date: 08/16/2022 Goal Status: Active Ulcer/skin breakdown will have a volume reduction of 30% by week 4 Date Initiated: 10/29/2021 Date Inactivated: 05/23/2022 Target Resolution Date: 04/13/2022 Unmet Reason: see wound Goal Status: Unmet measurement. Interventions: Assess patient/caregiver ability to obtain necessary supplies Assess patient/caregiver ability to perform ulcer/skin care regimen upon admission and as needed Assess ulceration(s) every visit Provide education on ulcer and skin care Notes: Electronic Signature(s) Signed: 07/04/2022 6:06:31 PM By: Maxwell Stall RN, BSN Entered By: [name] on 07/04/2022 14:07:56 Lodema Hong  ([id number]) [id number] Page 6 of 8 -------------------------------------------------------------------------------- Pain Assessment Details Patient Name: Date of Service: [name], [place] Rivers 1:30 PM Medical Record Number: [id number] Patient Account Number: [id number] Date of Birth/Sex: Treating RN: 1996/01/11 (26 y.o. M) Primary Care Jaz Laningham: [name] Other Clinician: Referring Shamari Trostel: Treating Bradie Lacock/Extender: [name] in Treatment: 91 Active Problems Location of Pain Severity and Description of Pain Patient Has Paino Patient Unable to Respond Site Locations Pain Management and Medication Current Pain Management: Electronic Signature(s) Signed: 07/05/2022 2:31:37 PM By: Thayer Dallas Entered By: Thayer Dallas on 07/04/2022 13:56:27 -------------------------------------------------------------------------------- Patient/Caregiver Education Details Patient Name: Date of Service: Alben Spittle 6/20/2024andnbsp1:30 PM Medical Record Number: [id number] Patient Account Number: [id number] Date of Birth/Gender: Treating RN: [birth year] (26 y.o. [name] Primary Care Physician: [name] Other Clinician: Referring Physician: Treating Physician/Extender: [name] in Treatment: 43 Education Assessment Education Provided To: Patient Education Topics Provided Wound/Skin Impairment: Handouts:  Caring for Your Ulcer Methods: Explain/Verbal Responses: Reinforcements needed Electronic Signature(s) Signed: 07/04/2022 6:06:31 PM By: Maxwell Stall RN, BSN Lodema Hong ([id number]) [id number] Page 7 of 8 Entered By: [name] on 07/04/2022 14:08:21 -------------------------------------------------------------------------------- Wound Assessment Details Patient Name: Date of Service: [name], [place] Rivers 1:30 PM Medical  Record Number: [id number] Patient Account Number: [id number] Date of Birth/Sex: Treating RN: [birth year] (26 y.o. M) Primary Care Zuley Lutter: [name] Other Clinician: Referring Royston Bekele: Treating Halley Kincer/Extender: [name] in Treatment: 35 Wound Status Wound Number: 1 Primary Etiology: Pressure Ulcer Wound Location: Left Ilium Wound Status: Open Wounding Event: Pressure Injury Comorbid History: Quadriplegia, Seizure Disorder Date Acquired: 10/08/2021 Weeks Of Treatment: 35 Clustered Wound: No Photos Wound Measurements Length: (cm) 0.4 Width: (cm) 0.1 Depth: (cm) 0.9 Area: (cm) 0.031 Volume: (cm) 0.028 % Reduction in Area: 99.8% % Reduction in Volume: 99.2% Epithelialization: Large (67-100%) Tunneling: Yes Position (o'clock): 1 Maximum Distance: (cm) 0.7 Undermining: No Wound Description Classification: Unstageable/Unclassified Wound Margin: Distinct, outline attached Exudate Amount: Medium Exudate Type: Serosanguineous Exudate Color: red, brown Foul Odor After Cleansing: No Slough/Fibrino Yes Wound Bed Granulation Amount: Large (67-100%) Exposed Structure Granulation Quality: Red Fascia Exposed: No Necrotic Amount: None Present (0%) Fat Layer (Subcutaneous Tissue) Exposed: Yes Tendon Exposed: No Muscle Exposed: No Joint Exposed: No Bone Exposed: Yes Periwound Skin Texture Texture Color No Abnormalities Noted: Yes No Abnormalities Noted: Yes Moisture Temperature / Pain No Abnormalities Noted: No Temperature: No Abnormality Dry / Scaly: No Maceration: No JAMINE, HIGHFILL ([id number]) [id number] Page 8 of 8 Treatment Notes Wound #1 (Ilium) Wound Laterality: Left Cleanser Normal Saline Discharge Instruction: Cleanse the wound with Normal Saline prior to applying a clean dressing using gauze sponges, not tissue or cotton balls. Wound Cleanser Discharge Instruction: Cleanse the wound with wound  cleanser prior to applying a clean dressing using gauze sponges, not tissue or cotton balls. Peri-Wound Care Topical Primary Dressing Hydrofera Blue Classic Foam Rope Dressing, 9x6 (mm/in) Discharge Instruction: Moisten with saline prior to packing Secondary Dressing Zetuvit Plus Silicone Border Dressing 4x4 (in/in) Discharge Instruction: Apply silicone border over primary dressing as directed. Secured With 45M Medipore H Soft Cloth Surgical T ape, 4 x 10 (in/yd) Discharge Instruction: Secure with tape as directed. Compression Wrap Compression Stockings Add-Ons Electronic Signature(s) Signed: 07/04/2022 6:06:31 PM By: Maxwell Stall RN, BSN Entered By: [name] on 07/04/2022 14:24:07 -------------------------------------------------------------------------------- Vitals Details Patient Name: Date of Service: Maxwell Laster, MA [name] 1:30 PM Medical Record Number: [id number] Patient Account Number: [id number] Date of Birth/Sex: Treating RN: September 11, 1995 (26 y.o. M) Primary Care Tasnim Balentine: [name] Other Clinician: Referring Kateria Cutrona: Treating Glenys Snader/Extender: [name] in Treatment: 35 Vital Signs Time Taken: 13:56 Temperature (F): 97.9 Weight (lbs): 95.1 Pulse (bpm): 98 Respiratory Rate (breaths/min): 18 Blood Pressure (mmHg): 111/75 Reference Range: 80 - 120 mg / dl Electronic Signature(s) Signed: 07/05/2022 2:31:37 PM By: Thayer Dallas Entered By: Thayer Dallas on 07/04/2022 13:56:20

## 2022-07-15 NOTE — Progress Notes (Signed)
LAMOND, RORIE ([id number]) [id number] Page 1 of 8 Visit Report for 05/23/2022 Arrival Information Details Patient Name: Date of Service: [name], [place] 05/23/2022 1:30 PM Medical Record Number: [id number] Patient Account Number: [id number] Date of Birth/Sex: Treating RN: [birth year] (26 y.o. M) Primary Care Habib Kise: Georgiann Hahn Other Clinician: Referring Angelee Bahr: Treating Amely Voorheis/Extender: Jonelle Sidle in Treatment: 29 Visit Information History Since Last Visit Added or deleted any medications: No Patient Arrived: Wheel Chair Any new allergies or adverse reactions: No Arrival Time: 14:17 Had a fall or experienced change in No Accompanied By: parents activities of daily living that may affect Transfer Assistance: Manual risk of falls: Patient Identification Verified: Yes Signs or symptoms of abuse/neglect since last visito No Secondary Verification Process Completed: Yes Hospitalized since last visit: No Patient Requires Transmission-Based Precautions: No Implantable device outside of the clinic excluding No Patient Has Alerts: No cellular tissue based products placed in the center since last visit: Has Dressing in Place as Prescribed: Yes Pain Present Now: No Electronic Signature(s) Signed: 07/15/2022 4:30:35 PM By: Thayer Dallas Entered By: Thayer Dallas on 05/23/2022 14:17:46 -------------------------------------------------------------------------------- Clinic Level of Care Assessment Details Patient Name: Date of Service: [name], [place] 05/23/2022 1:30 PM Medical Record Number: [id number] Patient Account Number: [id number] Date of Birth/Sex: Treating RN: [birth year] (26 y.o. [name] Primary Care Alvie Speltz: Georgiann Hahn Other Clinician: Referring Jeziel Hoffmann: Treating Sreenidhi Ganson/Extender: Jonelle Sidle in Treatment: 29 Clinic Level of Care Assessment Items TOOL 4 Quantity  Score X- 1 0 Use when only an EandM is performed on FOLLOW-UP visit ASSESSMENTS - Nursing Assessment / Reassessment X- 1 10 Reassessment of Co-morbidities (includes updates in patient status) X- 1 5 Reassessment of Adherence to Treatment Plan ASSESSMENTS - Wound and Skin A ssessment / Reassessment X - Simple Wound Assessment / Reassessment - one wound 1 5 []  - 0 Complex Wound Assessment / Reassessment - multiple wounds []  - 0 Dermatologic / Skin Assessment (not related to wound area) ASSESSMENTS - Focused Assessment []  - 0 Circumferential Edema Measurements - multi extremities []  - 0 Nutritional Assessment / Counseling / Intervention [name], [name] ([id number]) [id number] Page 2 of 8 []  - 0 Lower Extremity Assessment (monofilament, tuning fork, pulses) []  - 0 Peripheral Arterial Disease Assessment (using hand held doppler) ASSESSMENTS - Ostomy and/or Continence Assessment and Care []  - 0 Incontinence Assessment and Management []  - 0 Ostomy Care Assessment and Management (repouching, etc.) PROCESS - Coordination of Care X - Simple Patient / Family Education for ongoing care 1 15 []  - 0 Complex (extensive) Patient / Family Education for ongoing care X- 1 10 Staff obtains Chiropractor, Records, T Results / Process Orders est []  - 0 Staff telephones HHA, Nursing Homes / Clarify orders / etc []  - 0 Routine Transfer to another Facility (non-emergent condition) []  - 0 Routine Hospital Admission (non-emergent condition) []  - 0 New Admissions / Manufacturing engineer / Ordering NPWT Apligraf, etc. , []  - 0 Emergency Hospital Admission (emergent condition) X- 1 10 Simple Discharge Coordination []  - 0 Complex (extensive) Discharge Coordination PROCESS - Special Needs []  - 0 Pediatric / Minor Patient Management []  - 0 Isolation Patient Management []  - 0 Hearing / Language / Visual special needs []  - 0 Assessment of Community assistance  (transportation, D/C planning, etc.) []  - 0 Additional assistance / Altered mentation []  - 0 Support Surface(s) Assessment (bed, cushion, seat, etc.) INTERVENTIONS - Wound Cleansing / Measurement X - Simple Wound Cleansing -  one wound 1 5 []  - 0 Complex Wound Cleansing - multiple wounds X- 1 5 Wound Imaging (photographs - any number of wounds) []  - 0 Wound Tracing (instead of photographs) X- 1 5 Simple Wound Measurement - one wound []  - 0 Complex Wound Measurement - multiple wounds INTERVENTIONS - Wound Dressings X - Small Wound Dressing one or multiple wounds 1 10 []  - 0 Medium Wound Dressing one or multiple wounds []  - 0 Large Wound Dressing one or multiple wounds []  - 0 Application of Medications - topical []  - 0 Application of Medications - injection INTERVENTIONS - Miscellaneous []  - 0 External ear exam []  - 0 Specimen Collection (cultures, biopsies, blood, body fluids, etc.) []  - 0 Specimen(s) / Culture(s) sent or taken to Lab for analysis []  - 0 Patient Transfer (multiple staff / Nurse, adult / Similar devices) []  - 0 Simple Staple / Suture removal (25 or less) []  - 0 Complex Staple / Suture removal (26 or more) []  - 0 Hypo / Hyperglycemic Management (close monitor of Blood Glucose) [name], [name] ([id number]) [id number] Page 3 of 8 []  - 0 Ankle / Brachial Index (ABI) - do not check if billed separately X- 1 5 Vital Signs Has the patient been seen at the hospital within the last three years: Yes Total Score: 85 Level Of Care: New/Established - Level 3 Electronic Signature(s) Signed: 05/23/2022 5:46:17 PM By: Shawn Stall RN, BSN Entered By: Shawn Stall on 05/23/2022 15:02:42 -------------------------------------------------------------------------------- Encounter Discharge Information Details Patient Name: Date of Service: Maxwell Laster, [name] 05/23/2022 1:30 PM Medical Record Number: [id number] Patient Account Number:  [id number] Date of Birth/Sex: Treating RN: December 30, 1995 (26 y.o. [name] Primary Care Alanii Ramer: Georgiann Hahn Other Clinician: Referring Chris Narasimhan: Treating Sheina Mcleish/Extender: Jonelle Sidle in Treatment: 29 Encounter Discharge Information Items Discharge Condition: Stable Ambulatory Status: Wheelchair Discharge Destination: Home Transportation: Private Auto Accompanied By: parents Schedule Follow-up Appointment: Yes Clinical Summary of Care: Electronic Signature(s) Signed: 05/23/2022 5:46:17 PM By: Shawn Stall RN, BSN Entered By: Shawn Stall on 05/23/2022 15:03:11 -------------------------------------------------------------------------------- Lower Extremity Assessment Details Patient Name: Date of Service: [name], [name] 05/23/2022 1:30 PM Medical Record Number: [id number] Patient Account Number: [id number] Date of Birth/Sex: Treating RN: 20-Mar-1995 (26 y.o. M) Primary Care Karsyn Jamie: Georgiann Hahn Other Clinician: Referring Dafna Romo: Treating Sherri Levenhagen/Extender: Jonelle Sidle in Treatment: 29 Electronic Signature(s) Signed: 07/15/2022 4:30:35 PM By: Thayer Dallas Entered By: Thayer Dallas on 05/23/2022 14:18:00 -------------------------------------------------------------------------------- Multi Wound Chart Details Patient Name: Date of Service: Maxwell Laster, [name] 05/23/2022 1:30 PM Medical Record Number: [id number] Patient Account Number: [id number] [name], [name] ([id number]) [id number] Page 4 of 8 Date of Birth/Sex: Treating RN: 02-28-1995 (26 y.o. M) Primary Care Shaneka Efaw: Other Clinician: Georgiann Hahn Referring Shaneque Merkle: Treating Kirbie Stodghill/Extender: Jonelle Sidle in Treatment: 64 [1:Photos:] [N/A:N/A] Left Ilium N/A N/A Wound Location: Pressure Injury N/A N/A Wounding Event: Pressure Ulcer N/A N/A Primary Etiology: Quadriplegia, Seizure  Disorder N/A N/A Comorbid History: 10/08/2021 N/A N/A Date Acquired: 71 N/A N/A Weeks of Treatment: Open N/A N/A Wound Status: No N/A N/A Wound Recurrence: 0.3x0.3x1.1 N/A N/A Measurements L x W x D (cm) 0.071 N/A N/A A (cm) : rea 0.078 N/A N/A Volume (cm) : 99.60% N/A N/A % Reduction in A rea: 97.70% N/A N/A % Reduction in Volume: 3 Starting Position 1 (o'clock): 6 Ending Position 1 (o'clock): 1.1 Maximum Distance 1 (cm): Yes N/A N/A Undermining: Unstageable/Unclassified N/A N/A Classification: Medium N/A N/A  Exudate A mount: Serosanguineous N/A N/A Exudate Type: red, brown N/A N/A Exudate Color: Distinct, outline attached N/A N/A Wound Margin: Large (67-100%) N/A N/A Granulation A mount: Red N/A N/A Granulation Quality: None Present (0%) N/A N/A Necrotic A mount: Fat Layer (Subcutaneous Tissue): Yes N/A N/A Exposed Structures: Bone: Yes Fascia: No Tendon: No Muscle: No Joint: No Large (67-100%) N/A N/A Epithelialization: No Abnormalities Noted N/A N/A Periwound Skin Texture: Maceration: No N/A N/A Periwound Skin Moisture: Dry/Scaly: No No Abnormalities Noted N/A N/A Periwound Skin Color: No Abnormality N/A N/A Temperature: Treatment Notes Wound #1 (Ilium) Wound Laterality: Left Cleanser Normal Saline Discharge Instruction: Cleanse the wound with Normal Saline prior to applying a clean dressing using gauze sponges, not tissue or cotton balls. Wound Cleanser Discharge Instruction: Cleanse the wound with wound cleanser prior to applying a clean dressing using gauze sponges, not tissue or cotton balls. Peri-Wound Care Skin Prep Discharge Instruction: Use skin prep as directed Topical Primary Dressing Dakin's Solution 0.25%, 16 (oz) Discharge Instruction: Moisten gauze with Dakin's solution Promogran Prisma Matrix, 4.34 (sq in) (silver collagen) Discharge Instruction: Moisten collagen with saline or hydrogel Plain packing strip 1/4  (in) Discharge Instruction: Lightly pack as instructed [name], [name] ([id number]) [id number] Page 5 of 8 Secondary Dressing Zetuvit Plus Silicone Border Dressing 4x4 (in/in) Discharge Instruction: Apply silicone border over primary dressing as directed. Secured With 57M Medipore H Soft Cloth Surgical T ape, 4 x 10 (in/yd) Discharge Instruction: Secure with tape as directed. Compression Wrap Compression Stockings Add-Ons Electronic Signature(s) Signed: 05/23/2022 4:06:25 PM By: Geralyn Corwin DO Entered By: Geralyn Corwin on 05/23/2022 15:12:46 -------------------------------------------------------------------------------- Multi-Disciplinary Care Plan Details Patient Name: Date of Service: Maxwell Laster, [name] 05/23/2022 1:30 PM Medical Record Number: [id number] Patient Account Number: [id number] Date of Birth/Sex: Treating RN: 02-04-1995 (26 y.o. [name] Primary Care Vernel Donlan: Georgiann Hahn Other Clinician: Referring Noella Kipnis: Treating Alferd Obryant/Extender: Jonelle Sidle in Treatment: 29 Active Inactive Wound/Skin Impairment Nursing Diagnoses: Impaired tissue integrity Knowledge deficit related to ulceration/compromised skin integrity Goals: Patient/caregiver will verbalize understanding of skin care regimen Date Initiated: 10/29/2021 Target Resolution Date: 07/12/2022 Goal Status: Active Ulcer/skin breakdown will have a volume reduction of 30% by week 4 Date Initiated: 10/29/2021 Date Inactivated: 05/23/2022 Target Resolution Date: 04/13/2022 Unmet Reason: see wound Goal Status: Unmet measurement. Interventions: Assess patient/caregiver ability to obtain necessary supplies Assess patient/caregiver ability to perform ulcer/skin care regimen upon admission and as needed Assess ulceration(s) every visit Provide education on ulcer and skin care Notes: Electronic Signature(s) Signed: 05/23/2022 5:46:17 PM By: Shawn Stall RN, BSN Entered By: Shawn Stall on 05/23/2022 14:43:56 [name] ([id number]) [id number] Page 6 of 8 -------------------------------------------------------------------------------- Pain Assessment Details Patient Name: Date of Service: [name], [place] 05/23/2022 1:30 PM Medical Record Number: [id number] Patient Account Number: [id number] Date of Birth/Sex: Treating RN: Dec 12, 1995 (26 y.o. M) Primary Care Briah Nary: Georgiann Hahn Other Clinician: Referring Lorrin Bodner: Treating Atreus Hasz/Extender: Jonelle Sidle in Treatment: 29 Active Problems Location of Pain Severity and Description of Pain Patient Has Paino No Site Locations Pain Management and Medication Current Pain Management: Electronic Signature(s) Signed: 07/15/2022 4:30:35 PM By: Thayer Dallas Entered By: Thayer Dallas on 05/23/2022 14:18:08 -------------------------------------------------------------------------------- Patient/Caregiver Education Details Patient Name: Date of Service: Alben Spittle 5/9/2024andnbsp1:30 PM Medical Record Number: [id number] Patient Account Number: [id number] Date of Birth/Gender: Treating RN: 1995/06/20 (26 y.o. [name] Primary Care Physician: Georgiann Hahn Other Clinician: Referring Physician: Treating Physician/Extender: March Rummage,  Pearlie Oyster in Treatment: 29 Education Assessment Education Provided To: Patient Education Topics Provided Wound/Skin Impairment: Handouts: Caring for Your Ulcer Methods: Explain/Verbal Responses: Reinforcements needed Psychologist, prison and probation services) Signed: 05/23/2022 5:46:17 PM By: Shawn Stall RN, BSN [name] ([id number]) [id number] Page 7 of 8 Entered By: Shawn Stall on 05/23/2022 14:44:08 -------------------------------------------------------------------------------- Wound Assessment Details Patient Name: Date of  Service: [name], [place] 05/23/2022 1:30 PM Medical Record Number: [id number] Patient Account Number: [id number] Date of Birth/Sex: Treating RN: 11/29/1995 (26 y.o. M) Primary Care Oliviya Gilkison: Georgiann Hahn Other Clinician: Referring Illona Bulman: Treating Graciana Sessa/Extender: Jonelle Sidle in Treatment: 29 Wound Status Wound Number: 1 Primary Etiology: Pressure Ulcer Wound Location: Left Ilium Wound Status: Open Wounding Event: Pressure Injury Comorbid History: Quadriplegia, Seizure Disorder Date Acquired: 10/08/2021 Weeks Of Treatment: 29 Clustered Wound: No Photos Wound Measurements Length: (cm) 0.3 Width: (cm) 0.3 Depth: (cm) 1.1 Area: (cm) 0.071 Volume: (cm) 0.078 % Reduction in Area: 99.6% % Reduction in Volume: 97.7% Epithelialization: Large (67-100%) Tunneling: No Undermining: Yes Starting Position (o'clock): 3 Ending Position (o'clock): 6 Maximum Distance: (cm) 1.1 Wound Description Classification: Unstageable/Unclassified Wound Margin: Distinct, outline attached Exudate Amount: Medium Exudate Type: Serosanguineous Exudate Color: red, brown Foul Odor After Cleansing: No Slough/Fibrino Yes Wound Bed Granulation Amount: Large (67-100%) Exposed Structure Granulation Quality: Red Fascia Exposed: No Necrotic Amount: None Present (0%) Fat Layer (Subcutaneous Tissue) Exposed: Yes Tendon Exposed: No Muscle Exposed: No Joint Exposed: No Bone Exposed: Yes Periwound Skin Texture Texture Color No Abnormalities Noted: Yes No Abnormalities Noted: Yes Moisture Temperature / Pain No Abnormalities Noted: No Temperature: No Abnormality Dry / Scaly: No AMYR, WASZAK ([id number]) [id number] Page 8 of 8 Maceration: No Electronic Signature(s) Signed: 07/15/2022 4:30:35 PM By: Thayer Dallas Entered By: Thayer Dallas on 05/23/2022 14:24:44

## 2022-07-16 ENCOUNTER — Telehealth (INDEPENDENT_AMBULATORY_CARE_PROVIDER_SITE_OTHER): Payer: Self-pay | Admitting: Dietician

## 2022-07-16 NOTE — Telephone Encounter (Signed)
Who's calling (name and relationship to patient) : Damacio Claverie; mom   Best contact number: [phone]  Provider they see: Delorise Shiner, RD  Reason for call: Mom lvm stating that she would like to speak with Delorise Shiner. She is needing guidance on lowering caloric intake; he is over his targeted weight. She has requested a call back.    Call ID:      PRESCRIPTION REFILL ONLY  Name of prescription:  Pharmacy:

## 2022-07-16 NOTE — Telephone Encounter (Signed)
Call to mom She reports she weighed him about 5 wks ago and he was 101#. She thinks he weighs more now but does not have a scale at home.  He receives  3 oz of prune juice a day,  2 pouches of Juven,  3.5 pouches of liquid Hope a day. She decreased it from 4 pouches about 2 wks ago.  850 ml of water a day. The wounds on his femurs are healed. The pressure wound is approximately a CM.  Mom does not want him to gain too much weight. He is having formed stools but they are softer. She reports she can bring him to be weighed if needed. RN advised [name] is on PAL but RN will send her the information to respond to when she returns. Mom agrees that is fine.

## 2022-07-22 ENCOUNTER — Encounter (HOSPITAL_BASED_OUTPATIENT_CLINIC_OR_DEPARTMENT_OTHER): Payer: BC Managed Care – PPO | Attending: Internal Medicine | Admitting: Internal Medicine

## 2022-07-22 DIAGNOSIS — M4628 Osteomyelitis of vertebra, sacral and sacrococcygeal region: Secondary | ICD-10-CM | POA: Insufficient documentation

## 2022-07-22 DIAGNOSIS — G809 Cerebral palsy, unspecified: Secondary | ICD-10-CM | POA: Insufficient documentation

## 2022-07-22 DIAGNOSIS — L89320 Pressure ulcer of left buttock, unstageable: Secondary | ICD-10-CM | POA: Diagnosis not present

## 2022-07-22 DIAGNOSIS — G800 Spastic quadriplegic cerebral palsy: Secondary | ICD-10-CM | POA: Insufficient documentation
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
## 2022-07-23 ENCOUNTER — Telehealth: Payer: Self-pay | Admitting: Pediatrics

## 2022-07-23 MED ORDER — LANSOPRAZOLE 30 MG PO TBDD: DELAYED_RELEASE_TABLET | ORAL | 12 refills | Status: DC

## 2022-07-23 NOTE — Telephone Encounter (Signed)
Mother called stating patient needs a refill on Prevacid 30 mg tablet. Mother would like it be sent to [facility] as soon as possible. Mother apologizes for the last minute request but it is completely out. [facility] has been scheduled as well for August 14th at 4:15

## 2022-07-23 NOTE — Telephone Encounter (Signed)
Refilled prevacid to [facility]

## 2022-07-23 NOTE — Progress Notes (Signed)
[name] ([id number]) [phone].pdf Page 1 of 6 Visit Report for 07/22/2022 Arrival Information Details Patient Name: Date of Service: [name], [place] 07/22/2022 2:15 PM Medical Record Number: [id number] Patient Account Number: [id number] Date of Birth/Sex: Treating RN: [birth year] (26 y.o. M) Primary Care Joya Willmott: Georgiann Hahn Other Clinician: Referring Jasman Pfeifle: Treating Jolene Guyett/Extender: Jonelle Sidle in Treatment: 20 Visit Information History Since Last Visit Added or deleted any medications: No Patient Arrived: Wheel Chair Any new allergies or adverse reactions: No Arrival Time: 15:09 Had a fall or experienced change in No Accompanied By: parents activities of daily living that may affect Transfer Assistance: Manual risk of falls: Patient Identification Verified: Yes Signs or symptoms of abuse/neglect since last visito No Secondary Verification Process Completed: Yes Hospitalized since last visit: No Patient Requires Transmission-Based Precautions: No Implantable device outside of the clinic excluding No Patient Has Alerts: No cellular tissue based products placed in the center since last visit: Has Dressing in Place as Prescribed: Yes Pain Present Now: No Electronic Signature(s) Signed: 07/23/2022 4:04:59 PM By: Thayer Dallas Entered By: Thayer Dallas on 07/22/2022 15:14:53 -------------------------------------------------------------------------------- Lower Extremity Assessment Details Patient Name: Date of Service: [name], [name] 07/22/2022 2:15 PM Medical Record Number: [id number] Patient Account Number: [id number] Date of Birth/Sex: Treating RN: 02-20-1995 (26 y.o. M) Primary Care Brandt Chaney: Georgiann Hahn Other Clinician: Referring Orren Pietsch: Treating Caidence Kaseman/Extender: Jonelle Sidle in Treatment: 6 Electronic Signature(s) Signed: 07/23/2022 4:04:59 PM By: Thayer Dallas Entered By: Thayer Dallas on 07/22/2022 15:24:23 -------------------------------------------------------------------------------- Multi Wound Chart Details Patient Name: Date of Service: [name], [name] 07/22/2022 2:15 PM Medical Record Number: [id number] Patient Account Number: [id number] Date of Birth/Sex: Treating RN: May 04, 1995 (26 y.o. M) Primary Care Giavonna Pflum: Georgiann Hahn Other Clinician: Referring Rian Busche: Treating Jazma Pickel/Extender: Jonelle Sidle in Treatment: [address], [place] Rivers ([id number]) [id number] Page 2 of 6 Vital Signs Height(in): Pulse(bpm): 86 Weight(lbs): 95.1 Blood Pressure(mmHg): 104/68 Body Mass Index(BMI): Temperature(F): 97.8 Respiratory Rate(breaths/min): 18 [1:Photos:] [N/A:N/A] Left Ilium N/A N/A Wound Location: Pressure Injury N/A N/A Wounding Event: Pressure Ulcer N/A N/A Primary Etiology: Quadriplegia, Seizure Disorder N/A N/A Comorbid History: 10/08/2021 N/A N/A Date Acquired: 64 N/A N/A Weeks of Treatment: Open N/A N/A Wound Status: No N/A N/A Wound Recurrence: 0.4x0.1x0.2 N/A N/A Measurements L x W x D (cm) 0.031 N/A N/A A (cm) : rea 0.006 N/A N/A Volume (cm) : 99.80% N/A N/A % Reduction in A rea: 99.80% N/A N/A % Reduction in Volume: Unstageable/Unclassified N/A N/A Classification: Medium N/A N/A Exudate A mount: Serosanguineous N/A N/A Exudate Type: red, brown N/A N/A Exudate Color: Distinct, outline attached N/A N/A Wound Margin: Large (67-100%) N/A N/A Granulation A mount: Red N/A N/A Granulation Quality: None Present (0%) N/A N/A Necrotic A mount: Fat Layer (Subcutaneous Tissue): Yes N/A N/A Exposed Structures: Bone: Yes Fascia: No Tendon: No Muscle: No Joint: No Large (67-100%) N/A N/A Epithelialization: No Abnormalities Noted N/A N/A Periwound Skin Texture: Maceration: No N/A N/A Periwound Skin Moisture: Dry/Scaly: No No  Abnormalities Noted N/A N/A Periwound Skin Color: No Abnormality N/A N/A Temperature: Treatment Notes Electronic Signature(s) Signed: 07/22/2022 4:36:37 PM By: Geralyn Corwin DO Entered By: Geralyn Corwin on 07/22/2022 15:49:02 -------------------------------------------------------------------------------- Multi-Disciplinary Care Plan Details Patient Name: Date of Service: [name], [name]. 07/22/2022 2:15 PM Medical Record Number: [id number] Patient Account Number: [id number] Date of Birth/Sex: Treating RN: 06-04-1995 (26 y.o. [name] Primary Care Takelia Urieta: Georgiann Hahn Other Clinician: Referring Tahjir Silveria: Treating  Torri Langston/Extender: Jonelle Sidle in Treatment: [address], [name] ([id number]) [id number] Page 3 of 6 Active Inactive Wound/Skin Impairment Nursing Diagnoses: Impaired tissue integrity Knowledge deficit related to ulceration/compromised skin integrity Goals: Patient/caregiver will verbalize understanding of skin care regimen Date Initiated: 10/29/2021 Target Resolution Date: 08/16/2022 Goal Status: Active Ulcer/skin breakdown will have a volume reduction of 30% by week 4 Date Initiated: 10/29/2021 Date Inactivated: 05/23/2022 Target Resolution Date: 04/13/2022 Unmet Reason: see wound Goal Status: Unmet measurement. Interventions: Assess patient/caregiver ability to obtain necessary supplies Assess patient/caregiver ability to perform ulcer/skin care regimen upon admission and as needed Assess ulceration(s) every visit Provide education on ulcer and skin care Notes: Electronic Signature(s) Signed: 07/22/2022 5:30:48 PM By: Shawn Stall RN, BSN Entered By: Shawn Stall on 07/22/2022 15:44:08 -------------------------------------------------------------------------------- Pain Assessment Details Patient Name: Date of Service: [name], [name] 07/22/2022 2:15 PM Medical Record Number:  [id number] Patient Account Number: [id number] Date of Birth/Sex: Treating RN: December 05, 1995 (26 y.o. M) Primary Care Jaspreet Bodner: Georgiann Hahn Other Clinician: Referring Evangelynn Lochridge: Treating Kahli Mayon/Extender: Jonelle Sidle in Treatment: 43 Active Problems Location of Pain Severity and Description of Pain Patient Has Paino No Site Locations Pain Management and Medication Current Pain ManagementFRANCISCOJAVIER, CASAUS ([id number]) [phone].pdf Page 4 of 6 Electronic Signature(s) Signed: 07/23/2022 4:04:59 PM By: Thayer Dallas Entered By: Thayer Dallas on 07/22/2022 15:24:16 -------------------------------------------------------------------------------- Patient/Caregiver Education Details Patient Name: Date of Service: Alben Spittle 7/8/2024andnbsp2:15 PM Medical Record Number: [id number] Patient Account Number: [id number] Date of Birth/Gender: Treating RN: [birth year] (26 y.o. [name] Primary Care Physician: Georgiann Hahn Other Clinician: Referring Physician: Treating Physician/Extender: Jonelle Sidle in Treatment: 66 Education Assessment Education Provided To: Patient Education Topics Provided Wound/Skin Impairment: Handouts: Caring for Your Ulcer Methods: Explain/Verbal Responses: Reinforcements needed Electronic Signature(s) Signed: 07/22/2022 5:30:48 PM By: Shawn Stall RN, BSN Entered By: Shawn Stall on 07/22/2022 15:46:18 -------------------------------------------------------------------------------- Wound Assessment Details Patient Name: Date of Service: [name], [name] 07/22/2022 2:15 PM Medical Record Number: [id number] Patient Account Number: [id number] Date of Birth/Sex: Treating RN: 05-Jun-1995 (26 y.o. M) Primary Care Deundra Bard: Georgiann Hahn Other Clinician: Referring Ceclia Koker: Treating Langston Summerfield/Extender: Jonelle Sidle in Treatment: 38 Wound  Status Wound Number: 1 Primary Etiology: Pressure Ulcer Wound Location: Left Ilium Wound Status: Open Wounding Event: Pressure Injury Comorbid History: Quadriplegia, Seizure Disorder Date Acquired: 10/08/2021 Weeks Of Treatment: 38 Clustered Wound: No Photos ROMIN, FREESE ([id number]) [phone].pdf Page 5 of 6 Wound Measurements Length: (cm) 0.4 Width: (cm) 0.1 Depth: (cm) 0.2 Area: (cm) 0.031 Volume: (cm) 0.006 % Reduction in Area: 99.8% % Reduction in Volume: 99.8% Epithelialization: Large (67-100%) Tunneling: No Undermining: No Wound Description Classification: Unstageable/Unclassified Wound Margin: Distinct, outline attached Exudate Amount: Medium Exudate Type: Serosanguineous Exudate Color: red, brown Foul Odor After Cleansing: No Slough/Fibrino Yes Wound Bed Granulation Amount: Large (67-100%) Exposed Structure Granulation Quality: Red Fascia Exposed: No Necrotic Amount: None Present (0%) Fat Layer (Subcutaneous Tissue) Exposed: Yes Tendon Exposed: No Muscle Exposed: No Joint Exposed: No Bone Exposed: Yes Periwound Skin Texture Texture Color No Abnormalities Noted: Yes No Abnormalities Noted: Yes Moisture Temperature / Pain No Abnormalities Noted: No Temperature: No Abnormality Dry / Scaly: No Maceration: No Electronic Signature(s) Signed: 07/23/2022 4:04:59 PM By: Thayer Dallas Entered By: Thayer Dallas on 07/22/2022 15:26:00 -------------------------------------------------------------------------------- Vitals Details Patient Name: Date of Service: Maxwell Laster, [place] TTHEW Rivers. 07/22/2022 2:15 PM Medical Record Number: [id number] Patient Account Number: [id number] Date of Birth/Sex: Treating RN:  Jun 04, 1995 (26 y.o. M) Primary Care Kary Colaizzi: Georgiann Hahn Other Clinician: Referring Fredda Clarida: Treating Xylina Rhoads/Extender: Jonelle Sidle in Treatment: 46 Vital Signs Time Taken: 15:15 Temperature (F):  97.8 Weight (lbs): 95.1 Pulse (bpm): 86 Respiratory Rate (breaths/min): 18 Blood Pressure (mmHg): 104/68 Reference Range: 80 - 120 mg / dl ZARRAR, VOGELSANG ([id number]) [phone].pdf Page 6 of 6 Electronic Signature(s) Signed: 07/23/2022 4:04:59 PM By: Thayer Dallas Entered By: Thayer Dallas on 07/22/2022 15:15:38

## 2022-08-05 ENCOUNTER — Encounter (HOSPITAL_BASED_OUTPATIENT_CLINIC_OR_DEPARTMENT_OTHER): Payer: BC Managed Care – PPO | Admitting: Internal Medicine

## 2022-08-05 DIAGNOSIS — M4628 Osteomyelitis of vertebra, sacral and sacrococcygeal region: Secondary | ICD-10-CM | POA: Diagnosis not present

## 2022-08-05 DIAGNOSIS — L89320 Pressure ulcer of left buttock, unstageable: Secondary | ICD-10-CM | POA: Diagnosis not present

## 2022-08-05 DIAGNOSIS — G800 Spastic quadriplegic cerebral palsy: Secondary | ICD-10-CM | POA: Diagnosis not present

## 2022-08-05 DIAGNOSIS — G809 Cerebral palsy, unspecified: Secondary | ICD-10-CM
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
## 2022-08-06 ENCOUNTER — Encounter (HOSPITAL_BASED_OUTPATIENT_CLINIC_OR_DEPARTMENT_OTHER): Payer: BC Managed Care – PPO | Admitting: Internal Medicine

## 2022-08-07 NOTE — Progress Notes (Signed)
[name] ([id number]) [id number] Page 1 of 8 Visit Report for 08/05/2022 Arrival Information Details Patient Name: Date of Service: [name], [place] 08/05/2022 2:30 PM Medical Record Number: [id number] Patient Account Number: [id number] Date of Birth/Sex: Treating RN: [birth year] (26 y.o. M) Primary Care [name]: [name] [name]: Referring [name]: Treating Loreli Debruler/Extender: [name] in Treatment: 40 Visit Information History Since Last Visit Added or deleted any medications: No Patient Arrived: Wheel Chair Any new allergies or adverse reactions: No Arrival Time: 14:22 Had a fall or experienced change in No Accompanied By: mom activities of daily living that may affect Transfer Assistance: Manual risk of falls: Patient Identification Verified: Yes Signs or symptoms of abuse/neglect since last visito No Patient Requires Transmission-Based Precautions: No Hospitalized since last visit: No Patient Has Alerts: No Implantable device outside of the clinic excluding No cellular tissue based products placed in the center since last visit: Has Dressing in Place as Prescribed: Yes Pain Present Now: No Electronic Signature(s) Signed: 08/07/2022 11:54:52 AM By: [name] Entered By: [name] -------------------------------------------------------------------------------- Clinic Level of Care Assessment Details Patient Name: Date of Service: [name], [place] 08/05/2022 2:30 PM Medical Record Number: [id number] Patient Account Number: [id number] Date of Birth/Sex: Treating RN: [name] (26 y.o. [name] Primary Care [name]: [name] [name]: Referring [name]: Treating [name]/Extender: [name] in Treatment: 40 Clinic Level of Care Assessment Items TOOL 4 Quantity Score X- 1 0 Use when only an EandM is  performed on FOLLOW-UP visit ASSESSMENTS - Nursing Assessment / Reassessment X- 1 10 Reassessment of Co-morbidities (includes updates in patient status) X- 1 5 Reassessment of Adherence to Treatment Plan ASSESSMENTS - Wound and Skin A ssessment / Reassessment X - Simple Wound Assessment / Reassessment - one wound 1 5 []  - 0 Complex Wound Assessment / Reassessment - multiple wounds []  - 0 Dermatologic / Skin Assessment (not related to wound area) ASSESSMENTS - Focused Assessment []  - 0 Circumferential Edema Measurements - multi extremities []  - 0 Nutritional Assessment / Counseling / Intervention [name], [name] ([id number]) [id number] Page 2 of 8 []  - 0 Lower Extremity Assessment (monofilament, tuning fork, pulses) []  - 0 Peripheral Arterial Disease Assessment (using hand held doppler) ASSESSMENTS - Ostomy and/or Continence Assessment and Care []  - 0 Incontinence Assessment and Management []  - 0 Ostomy Care Assessment and Management (repouching, etc.) PROCESS - Coordination of Care X - Simple Patient / Family Education for ongoing care 1 15 []  - 0 Complex (extensive) Patient / Family Education for ongoing care X- 1 10 Staff obtains Chiropractor, Records, T Results / Process Orders est []  - 0 Staff telephones HHA, Nursing Homes / Clarify orders / etc []  - 0 Routine Transfer to another Facility (non-emergent condition) []  - 0 Routine Hospital Admission (non-emergent condition) []  - 0 New Admissions / Manufacturing engineer / Ordering NPWT Apligraf, etc. , []  - 0 Emergency Hospital Admission (emergent condition) X- 1 10 Simple Discharge Coordination []  - 0 Complex (extensive) Discharge Coordination PROCESS - Special Needs []  - 0 Pediatric / Minor Patient Management []  - 0 Isolation Patient Management []  - 0 Hearing / Language / Visual special needs []  - 0 Assessment of Community assistance (transportation, D/C planning, etc.) []  -  0 Additional assistance / Altered mentation []  - 0 Support Surface(s) Assessment (bed, cushion, seat, etc.) INTERVENTIONS - Wound Cleansing / Measurement X - Simple Wound Cleansing - one wound 1 5 []  -  0 Complex Wound Cleansing - multiple wounds X- 1 5 Wound Imaging (photographs - any number of wounds) []  - 0 Wound Tracing (instead of photographs) X- 1 5 Simple Wound Measurement - one wound []  - 0 Complex Wound Measurement - multiple wounds INTERVENTIONS - Wound Dressings X - Small Wound Dressing one or multiple wounds 1 10 []  - 0 Medium Wound Dressing one or multiple wounds []  - 0 Large Wound Dressing one or multiple wounds []  - 0 Application of Medications - topical []  - 0 Application of Medications - injection INTERVENTIONS - Miscellaneous []  - 0 External ear exam []  - 0 Specimen Collection (cultures, biopsies, blood, body fluids, etc.) []  - 0 Specimen(s) / Culture(s) sent or taken to Lab for analysis []  - 0 Patient Transfer (multiple staff / Nurse, adult / Similar devices) []  - 0 Simple Staple / Suture removal (25 or less) []  - 0 Complex Staple / Suture removal (26 or more) []  - 0 Hypo / Hyperglycemic Management (close monitor of Blood Glucose) [name], [name] ([id number]) [id number] Page 3 of 8 []  - 0 Ankle / Brachial Index (ABI) - do not check if billed separately X- 1 5 Vital Signs Has the patient been seen at the hospital within the last three years: Yes Total Score: 85 Level Of Care: New/Established - Level 3 Electronic Signature(s) Signed: 08/06/2022 4:56:09 PM By: [name] Entered By: [name] on 08/05/2022 14:53:28 -------------------------------------------------------------------------------- Encounter Discharge Information Details Patient Name: Date of Service: [name], Maxwell Saxon G. 08/05/2022 2:30 PM Medical Record Number: [id number] Patient Account Number: [id number] Date of Birth/Sex: Treating RN: [birth year]  (26 y.o. [name] Primary Care [name]: [name] [name]: Referring [name]: Treating [name]: [name] in Treatment: 81 Encounter Discharge Information Items Discharge Condition: Stable Ambulatory Status: Wheelchair Discharge Destination: Home Transportation: Private Auto Accompanied By: self Schedule Follow-up Appointment: Yes Clinical Summary of Care: Electronic Signature(s) Signed: 08/06/2022 4:56:09 PM By: [name] Entered By: [name] on 08/05/2022 14:54:56 -------------------------------------------------------------------------------- Lower Extremity Assessment Details Patient Name: Date of Service: [name], [name]. 08/05/2022 2:30 PM Medical Record Number: [id number] Patient Account Number: [id number] Date of Birth/Sex: Treating RN: [birth year]Rivers (26 y.o. M) Primary Care Maye Parkinson: [name] [name]: Referring Kingson Lohmeyer: Treating Alexyss Balzarini/Extender: [name] in Treatment: 40 Electronic Signature(s) Signed: 08/07/2022 11:54:52 AM By: [name] Entered By: [name] on 08/05/2022 14:27:45 -------------------------------------------------------------------------------- Multi Wound Chart Details Patient Name: Date of Service: Maxwell Laster, Maxwell [name] 08/05/2022 2:30 PM Medical Record Number: [id number] Patient Account Number: [id number] [name], [name] ([id number]) [id number] Page 4 of 8 Date of Birth/Sex: Treating RN: [birth year] (26 y.o. M) Primary Care Karima Carrell: [name]: [name] Referring Dazhane Villagomez: Treating Samil Mecham/Extender: [name] in Treatment: 40 Vital Signs Height(in): Pulse(bpm): 114 Weight(lbs): 95.1 Blood Pressure(mmHg): 109/73 Body Mass Index(BMI): Temperature(F): 98.2 Respiratory Rate(breaths/min): 18 [1:Photos:] [N/A:N/A] Left Ilium N/A N/A Wound  Location: Pressure Injury N/A N/A Wounding Event: Pressure Ulcer N/A N/A Primary Etiology: Quadriplegia, Seizure Disorder N/A N/A Comorbid History: 10/08/2021 N/A N/A Date Acquired: 40 N/A N/A Weeks of Treatment: Open N/A N/A Wound Status: No N/A N/A Wound Recurrence: 0x0x0 N/A N/A Measurements L x W x D (cm) 0 N/A N/A A (cm) : rea 0 N/A N/A Volume (cm) : 100.00% N/A N/A % Reduction in A rea: 100.00% N/A N/A % Reduction in Volume: Unstageable/Unclassified N/A N/A Classification: Medium N/A N/A Exudate A mount: Serosanguineous N/A N/A Exudate  Type: red, brown N/A N/A Exudate Color: Distinct, outline attached N/A N/A Wound Margin: Large (67-100%) N/A N/A Granulation A mount: Red N/A N/A Granulation Quality: None Present (0%) N/A N/A Necrotic A mount: Fat Layer (Subcutaneous Tissue): Yes N/A N/A Exposed Structures: Bone: Yes Fascia: No Tendon: No Muscle: No Joint: No Large (67-100%) N/A N/A Epithelialization: No Abnormalities Noted N/A N/A Periwound Skin Texture: Maceration: No N/A N/A Periwound Skin Moisture: Dry/Scaly: No No Abnormalities Noted N/A N/A Periwound Skin Color: No Abnormality N/A N/A Temperature: Treatment Notes Wound #1 (Ilium) Wound Laterality: Left Cleanser Peri-Wound Care Topical Primary Dressing Secondary Dressing Secured With Compression Wrap Compression Stockings Add-Ons [name], [name] ([id number]) [id number] Page 5 of 8 Electronic Signature(s) Signed: 08/05/2022 4:55:26 PM By: Geralyn Corwin DO Entered By: Geralyn Corwin on 08/05/2022 15:07:54 -------------------------------------------------------------------------------- Multi-Disciplinary Care Plan Details Patient Name: Date of Service: [name], [place] G. 08/05/2022 2:30 PM Medical Record Number: [id number] Patient Account Number: [id number] Date of Birth/Sex: Treating RN: [birth year] (26 y.o. [name] Primary Care Kaiyon Hynes:  [name] [name]: Referring Janelys Glassner: Treating Ren Aspinall/Extender: [name] in Treatment: 40 Active Inactive Wound/Skin Impairment Nursing Diagnoses: Impaired tissue integrity Knowledge deficit related to ulceration/compromised skin integrity Goals: Patient/caregiver will verbalize understanding of skin care regimen Date Initiated: 10/29/2021 Target Resolution Date: 09/06/2022 Goal Status: Active Ulcer/skin breakdown will have a volume reduction of 30% by week 4 Date Initiated: 10/29/2021 Date Inactivated: 05/23/2022 Target Resolution Date: 04/13/2022 Unmet Reason: see wound Goal Status: Unmet measurement. Interventions: Assess patient/caregiver ability to obtain necessary supplies Assess patient/caregiver ability to perform ulcer/skin care regimen upon admission and as needed Assess ulceration(s) every visit Provide education on ulcer and skin care Notes: Electronic Signature(s) Signed: 08/06/2022 4:56:09 PM By: [name] Entered By: [name] on 08/05/2022 14:49:49 -------------------------------------------------------------------------------- Pain Assessment Details Patient Name: Date of Service: [name], Maxwell Saxon G. 08/05/2022 2:30 PM Medical Record Number: [id number] Patient Account Number: [id number] Date of Birth/Sex: Treating RN: Rivers[birth year] (26 y.o. M) Primary Care Donnia Poplaski: [name] [name]: Referring Adekunle Rohrbach: Treating Ady Heimann/Extender: [name] in Treatment: 40 Active Problems Location of Pain Severity and Description of Pain Patient Has Paino No Site Locations [name], [name] ([id number]) [id number] Page 6 of 8 Pain Management and Medication Current Pain Management: Electronic Signature(s) Signed: 08/07/2022 11:54:52 AM By: [name] Entered By: [name] on 08/05/2022  14:27:35 -------------------------------------------------------------------------------- Patient/Caregiver Education Details Patient Name: Date of Service: [name] 7/22/2024andnbsp2:30 PM Medical Record Number: [id number] Patient Account Number: [id number] Date of Birth/Gender: Treating RN: [birth year]Rivers (26 y.o. [name] Primary Care Physician: [name] [name]: Referring Physician: Treating Physician/Extender: [name] in Treatment: 40 Education Assessment Education Provided To: Patient Education Topics Provided Pressure: Handouts: Pressure Injury: Prevention and Offloading Methods: Explain/Verbal Responses: Reinforcements needed Electronic Signature(s) Signed: 08/06/2022 4:56:09 PM By: [name] Entered By: [name] on 08/05/2022 14:53:07 -------------------------------------------------------------------------------- Wound Assessment Details Patient Name: Date of Service: [name], [name] 08/05/2022 2:30 PM Medical Record Number: [id number] Patient Account Number: [id number] Date of Birth/Sex: Treating RN: Rivers[birth year] (26 y.o. [name] Primary Care Conall Vangorder: [name] [name]: Lodema Hong ([id number]) [id number] Page 7 of 8 Referring Hazell Siwik: Treating Eurika Sandy/Extender: [name] in Treatment: 40 Wound Status Wound Number: 1 Primary Etiology: Pressure Ulcer Wound Location: Left Ilium Wound Status: Open Wounding Event: Pressure Injury Comorbid History: Quadriplegia, Seizure Disorder Date Acquired: 10/08/2021 Weeks Of Treatment:  40 Clustered Wound: No Photos Wound Measurements Length: (cm) Width: (cm) Depth: (cm) Area: (cm) Volume: (cm) 0 % Reduction in Area: 100% 0 % Reduction in Volume: 100% 0 Epithelialization: Large (67-100%) 0 Tunneling: No 0 Undermining: No Wound Description Classification:  Unstageable/Unclassified Wound Margin: Distinct, outline attached Exudate Amount: Medium Exudate Type: Serosanguineous Exudate Color: red, brown Foul Odor After Cleansing: No Slough/Fibrino Yes Wound Bed Granulation Amount: Large (67-100%) Exposed Structure Granulation Quality: Red Fascia Exposed: No Necrotic Amount: None Present (0%) Fat Layer (Subcutaneous Tissue) Exposed: Yes Tendon Exposed: No Muscle Exposed: No Joint Exposed: No Bone Exposed: Yes Periwound Skin Texture Texture Color No Abnormalities Noted: Yes No Abnormalities Noted: Yes Moisture Temperature / Pain No Abnormalities Noted: No Temperature: No Abnormality Dry / Scaly: No Maceration: No Electronic Signature(s) Signed: 08/06/2022 4:56:09 PM By: [name] Entered By: [name] on 08/05/2022 14:45:19 -------------------------------------------------------------------------------- Vitals Details Patient Name: Date of Service: Maxwell Laster, Maxwell [name] 08/05/2022 2:30 PM Medical Record Number: [id number] Patient Account Number: [id number] Date of Birth/Sex: Treating RN: [birth year] Rivers (26 y.o. [name] ([id number]) [id number] Page 8 of 8 Primary Care Ellese Julius: [name] [name]: Referring Adaly Puder: Treating Hodaya Curto/Extender: [name] in Treatment: 40 Vital Signs Time Taken: 14:27 Temperature (F): 98.2 Weight (lbs): 95.1 Pulse (bpm): 114 Respiratory Rate (breaths/min): 18 Blood Pressure (mmHg): 109/73 Reference Range: 80 - 120 mg / dl Electronic Signature(s) Signed: 08/07/2022 11:54:52 AM By: [name] Entered By: [name] on 08/05/2022 14:27:26

## 2022-08-19 ENCOUNTER — Encounter (HOSPITAL_BASED_OUTPATIENT_CLINIC_OR_DEPARTMENT_OTHER): Payer: BC Managed Care – PPO | Attending: Internal Medicine | Admitting: Internal Medicine

## 2022-08-19 DIAGNOSIS — G809 Cerebral palsy, unspecified: Secondary | ICD-10-CM | POA: Insufficient documentation

## 2022-08-19 DIAGNOSIS — Z931 Gastrostomy status: Secondary | ICD-10-CM | POA: Insufficient documentation

## 2022-08-19 DIAGNOSIS — M4628 Osteomyelitis of vertebra, sacral and sacrococcygeal region: Secondary | ICD-10-CM | POA: Diagnosis not present

## 2022-08-19 DIAGNOSIS — Z833 Family history of diabetes mellitus: Secondary | ICD-10-CM | POA: Insufficient documentation

## 2022-08-19 DIAGNOSIS — G40909 Epilepsy, unspecified, not intractable, without status epilepticus: Secondary | ICD-10-CM | POA: Insufficient documentation

## 2022-08-19 DIAGNOSIS — L89320 Pressure ulcer of left buttock, unstageable: Secondary | ICD-10-CM | POA: Insufficient documentation
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
## 2022-08-19 NOTE — Progress Notes (Signed)
[name] ([id number]) [id number] Page 1 of 5 Visit Report for 08/19/2022 Arrival Information Details Patient Name: Date of Service: [name], [name] 08/19/2022 2:30 PM Medical Record Number: [id number] Patient Account Number: [id number] Date of Birth/Sex: Treating RN: Aug 30, 1995 (26 y.o. M) Primary Care [name]: [name] Other Clinician: Referring [name]: Treating Loc [name]/Extender: [name] in Treatment: 53 Visit Information History Since Last Visit Added or deleted any medications: No Patient Arrived: Wheel Chair Any new allergies or adverse reactions: No Arrival Time: 14:38 Had a fall or experienced change in No Accompanied By: mother activities of daily living that may affect Transfer Assistance: Manual risk of falls: Patient Identification Verified: Yes Signs or symptoms of abuse/neglect since last visito No Secondary Verification Process Completed: Yes Hospitalized since last visit: No Patient Requires Transmission-Based Precautions: No Implantable device outside of the clinic excluding No Patient Has Alerts: No cellular tissue based products placed in the center since last visit: Has Dressing in Place as Prescribed: Yes Pain Present Now: No Electronic Signature(s) Signed: 08/19/2022 5:04:15 PM By: Thayer Dallas Entered By: Thayer Dallas on 08/19/2022 14:46:21 -------------------------------------------------------------------------------- Clinic Level of Care Assessment Details Patient Name: Date of Service: [name], [name] 08/19/2022 2:30 PM Medical Record Number: [id number] Patient Account Number: [id number] Date of Birth/Sex: Treating RN: Jul 12, 1995 (26 y.o. [name] Primary Care [name]: [name] Other Clinician: Referring [name]: Treating [name]/Extender: [name] in Treatment: 42 Clinic Level of Care Assessment Items TOOL 4 Quantity  Score X- 1 0 Use when only an EandM is performed on FOLLOW-UP visit ASSESSMENTS - Nursing Assessment / Reassessment X- 1 10 Reassessment of Co-morbidities (includes updates in patient status) X- 1 5 Reassessment of Adherence to Treatment Plan ASSESSMENTS - Wound and Skin A ssessment / Reassessment X - Simple Wound Assessment / Reassessment - one wound 1 5 []  - 0 Complex Wound Assessment / Reassessment - multiple wounds []  - 0 Dermatologic / Skin Assessment (not related to wound area) ASSESSMENTS - Focused Assessment []  - 0 Circumferential Edema Measurements - multi extremities []  - 0 Nutritional Assessment / Counseling / Intervention [name], [name] ([id number]) [id number] Page 2 of 5 []  - 0 Lower Extremity Assessment (monofilament, tuning fork, pulses) []  - 0 Peripheral Arterial Disease Assessment (using hand held doppler) ASSESSMENTS - Ostomy and/or Continence Assessment and Care []  - 0 Incontinence Assessment and Management []  - 0 Ostomy Care Assessment and Management (repouching, etc.) PROCESS - Coordination of Care []  - 0 Simple Patient / Family Education for ongoing care []  - 0 Complex (extensive) Patient / Family Education for ongoing care X- 1 10 Staff obtains Chiropractor, Records, T Results / Process Orders est []  - 0 Staff telephones HHA, Nursing Homes / Clarify orders / etc []  - 0 Routine Transfer to another Facility (non-emergent condition) []  - 0 Routine Hospital Admission (non-emergent condition) []  - 0 New Admissions / Manufacturing engineer / Ordering NPWT Apligraf, etc. , []  - 0 Emergency Hospital Admission (emergent condition) X- 1 10 Simple Discharge Coordination []  - 0 Complex (extensive) Discharge Coordination PROCESS - Special Needs []  - 0 Pediatric / Minor Patient Management []  - 0 Isolation Patient Management []  - 0 Hearing / Language / Visual special needs []  - 0 Assessment of Community assistance  (transportation, D/C planning, etc.) []  - 0 Additional assistance / Altered mentation []  - 0 Support Surface(s) Assessment (bed, cushion, seat, etc.) INTERVENTIONS - Wound Cleansing / Measurement X - Simple Wound Cleansing - one  wound 1 5 []  - 0 Complex Wound Cleansing - multiple wounds X- 1 5 Wound Imaging (photographs - any number of wounds) []  - 0 Wound Tracing (instead of photographs) X- 1 5 Simple Wound Measurement - one wound []  - 0 Complex Wound Measurement - multiple wounds INTERVENTIONS - Wound Dressings X - Small Wound Dressing one or multiple wounds 1 10 []  - 0 Medium Wound Dressing one or multiple wounds []  - 0 Large Wound Dressing one or multiple wounds []  - 0 Application of Medications - topical []  - 0 Application of Medications - injection INTERVENTIONS - Miscellaneous []  - 0 External ear exam []  - 0 Specimen Collection (cultures, biopsies, blood, body fluids, etc.) []  - 0 Specimen(s) / Culture(s) sent or taken to Lab for analysis []  - 0 Patient Transfer (multiple staff / Nurse, adult / Similar devices) []  - 0 Simple Staple / Suture removal (25 or less) []  - 0 Complex Staple / Suture removal (26 or more) []  - 0 Hypo / Hyperglycemic Management (close monitor of Blood Glucose) [name], [name] ([id number]) [id number] Page 3 of 5 []  - 0 Ankle / Brachial Index (ABI) - do not check if billed separately X- 1 5 Vital Signs Has the patient been seen at the hospital within the last three years: Yes Total Score: 70 Level Of Care: New/Established - Level 2 Electronic Signature(s) Signed: 08/19/2022 4:30:08 PM By: Maxwell Pulling RN, BSN Entered By: [name] on 08/19/2022 16:09:33 -------------------------------------------------------------------------------- Lower Extremity Assessment Details Patient Name: Date of Service: [name], [name]. 08/19/2022 2:30 PM Medical Record Number: [id number] Patient Account Number: [id number] Date  of Birth/Sex: Treating RN: May 01, 1995 (26 y.o. M) Primary Care [name]: [name] Other Clinician: Referring [name]: Treating [name]/Extender: [name] in Treatment: 42 Electronic Signature(s) Signed: 08/19/2022 5:04:15 PM By: Thayer Dallas Entered By: Thayer Dallas on 08/19/2022 14:47:24 -------------------------------------------------------------------------------- Multi Wound Chart Details Patient Name: Date of Service: [name], [name]. 08/19/2022 2:30 PM Medical Record Number: [id number] Patient Account Number: [id number] Date of Birth/Sex: Treating RN: March 14, 1995 (26 y.o. M) Primary Care Cathlene Gardella: [name] Other Clinician: Referring Monique Hefty: Treating Clell Trahan/Extender: [name] in Treatment: 42 Vital Signs Height(in): Pulse(bpm): 91 Weight(lbs): 95.1 Blood Pressure(mmHg): 125/76 Body Mass Index(BMI): Temperature(F): 98.1 Respiratory Rate(breaths/min): 18 [Treatment Notes:Wound Assessments Treatment Notes] Electronic Signature(s) Signed: 08/19/2022 4:29:36 PM By: Geralyn Corwin DO Entered By: Geralyn Corwin on 08/19/2022 15:21:18 Multi-Disciplinary Care Plan Details -------------------------------------------------------------------------------- Lodema Hong ([id number]) [id number] Page 4 of 5 Patient Name: Date of Service: [name], [name] 08/19/2022 2:30 PM Medical Record Number: [id number] Patient Account Number: [id number] Date of Birth/Sex: Treating RN: 1995-10-09 (26 y.o. [name] Primary Care Klayten Jolliff: [name] Other Clinician: Referring Nixon Kolton: Treating Dare Sanger/Extender: [name] in Treatment: 31 Active Inactive Electronic Signature(s) Signed: 08/19/2022 4:30:08 PM By: Maxwell Pulling RN, BSN Entered By: [name] on 08/19/2022  15:05:38 -------------------------------------------------------------------------------- Pain Assessment Details Patient Name: Date of Service: [name], [name] 08/19/2022 2:30 PM Medical Record Number: [id number] Patient Account Number: [id number] Date of Birth/Sex: Treating RN: 1995-05-30 (26 y.o. M) Primary Care Rupinder Livingston: [name] Other Clinician: Referring Jerita Wimbush: Treating Keriann Rankin/Extender: [name] in Treatment: 59 Active Problems Location of Pain Severity and Description of Pain Patient Has Paino No Site Locations Pain Management and Medication Current Pain Management: Electronic Signature(s) Signed: 08/19/2022 5:04:15 PM By: Thayer Dallas Entered By: Thayer Dallas on 08/19/2022 14:47:05 -------------------------------------------------------------------------------- Patient/Caregiver Education Details Patient Name:  Date of Service: [name], [place] TTHEW Rivers. 8/5/2024andnbsp2:30 PM LEMON, VIEW ([id number]) [id number] Page 5 of 5 Medical Record Number: [id number] Patient Account Number: [id number] Date of Birth/Gender: Treating RN: 1995-02-25 (27 y.o. [name] Primary Care Physician: [name] Other Clinician: Referring Physician: Treating Physician/Extender: [name] in Treatment: 74 Education Assessment Education Provided To: Patient Education Topics Provided Wound/Skin Impairment: Methods: Explain/Verbal Responses: State content correctly [name]) Signed: 08/19/2022 4:30:08 PM By: Maxwell Pulling RN, BSN Entered By: [name] on 08/19/2022 15:05:57 -------------------------------------------------------------------------------- Vitals Details Patient Name: Date of Service: Maxwell Laster, Maxwell TTHEW Rivers. 08/19/2022 2:30 PM Medical Record Number: [id number] Patient Account Number: [id number] Date of Birth/Sex: Treating RN: [birth year] (26 y.o.  M) Primary Care Toniette Devera: [name] Other Clinician: Referring Galilea Quito: Treating Tacari Repass/Extender: [name] in Treatment: 42 Vital Signs Time Taken: 14:46 Temperature (F): 98.1 Weight (lbs): 95.1 Pulse (bpm): 91 Respiratory Rate (breaths/min): 18 Blood Pressure (mmHg): 125/76 Reference Range: 80 - 120 mg / dl Electronic Signature(s) Signed: 08/19/2022 5:04:15 PM By: Thayer Dallas Entered By: Thayer Dallas on 08/19/2022 14:46:55

## 2022-08-28 ENCOUNTER — Ambulatory Visit: Payer: BC Managed Care – PPO | Admitting: Pediatrics

## 2022-08-28 VITALS — BP 102/78 | Wt 104.8 lb

## 2022-08-28 DIAGNOSIS — Z931 Gastrostomy status: Secondary | ICD-10-CM

## 2022-08-28 DIAGNOSIS — Z68.41 Body mass index (BMI) pediatric, 5th percentile to less than 85th percentile for age: Secondary | ICD-10-CM

## 2022-08-28 DIAGNOSIS — F88 Other disorders of psychological development: Secondary | ICD-10-CM

## 2022-08-28 DIAGNOSIS — Z0001 Encounter for general adult medical examination with abnormal findings: Secondary | ICD-10-CM

## 2022-08-28 DIAGNOSIS — G8250 Quadriplegia, unspecified: Secondary | ICD-10-CM

## 2022-08-28 DIAGNOSIS — G40219 Localization-related (focal) (partial) symptomatic epilepsy and epileptic syndromes with complex partial seizures, intractable, without status epilepticus: Secondary | ICD-10-CM | POA: Diagnosis not present

## 2022-08-28 DIAGNOSIS — Z23 Encounter for immunization: Secondary | ICD-10-CM | POA: Diagnosis not present

## 2022-08-28 DIAGNOSIS — G40309 Generalized idiopathic epilepsy and epileptic syndromes, not intractable, without status epilepticus: Secondary | ICD-10-CM

## 2022-08-28 DIAGNOSIS — Z0000 Encounter for general adult medical examination without abnormal findings: Secondary | ICD-10-CM

## 2022-08-29 NOTE — Progress Notes (Signed)
Medical Nutrition Therapy - Progress Note Appt start time: 1:45 PM  Appt end time: 2:45 PM  Reason for referral: Gtube dependence Referring provider: Dr. Moody Bruins - Neuro Pertinent medical hx: Spastic Quadriparesis, Intellectual Disability, Laryngopharyngeal reflux, Epilepsy, Cerebral Palsy, Chromosomal deletion syndrome, dysphagia, +Gtube  Assessment: Food allergies: none noted in Epic  Pertinent Medications: see medication list - Keppra, Prevacid Vitamins/Supplements: none Pertinent labs: no recent labs in Epic   (8/14) Anthropometrics: Ht: 162.5 cm     Wt: 47.7 kg *Mom requested weight be used from doctor's appointment on 08/28/22.* BMI: 17.9 kg/m2      IBW based on Hamwi Equation: 130 # (59.0 kg)     08/28/22 Wt: 47.7 kg, BMI: 17.9 05/02/22 Wt: 43.1 kg, BMI: 16.3 12/12/21 Wt: 45.4 kg, BMI: 17.1  07/11/21 Wt: 45.4 kg; BMI: 17.1 05/31/21 Wt: 45.4 kg; BMI: 17.1 01/25/21 Wt: 45.4 kg; BMI: 17.1  12/21/20 Wt: 45.4 kg; BMI: 17.1  Estimated minimum caloric needs: 1500 kcal/day (based on weight gain with current regimen) Estimated minimum protein needs: 0.8-1.0 g/day (DRI and clinical judgement) Estimated minimum fluid needs: 1500 mL/day (1 mL/kcal)   Primary concerns today: Follow-up for gtube dependence. Mom accompanied pt to appt today.   Dietary Intake Hx: DME: [employer], fax: [phone]  Formula: Liquid Hope Peptide  Current regimen:  Day feeds: 12 oz (1 pouch) @ ~420 mL/hr x 3 feeds (8:30 AM, 1:30 PM, 7 PM)  Overnight feeds: none Total Volume: 3 pouches total   FWF: 50 mL after each feed and meds (x4); 100 mL with morning meds, 50 mL with Keppra, 150 mL @ 1, 3, 5 (850-900 mL averaging during the day)   Nutritional Supplements: 1.5 medicine cups of prune juice with each feed (~3-4.5 oz)   Position during feeds: upright in chair (stays upright for at least a few hours after feed to aid in reflux) PO foods/beverages: tastes occasionally  Notes: Mom reports Obama has  graduated from wound care and is greatly improving in regards to overall health. Mom reduced Maisen's formula from 4 pouches daily to 3 approximately a month and a half ago given excess weight gain.   GI: every day or two (if longer than this then suppository is given)  GU: 4-5+/day (very saturated, typically straw colored urine)   Physical Activity: wheelchair bound  Estimated caloric intake: 1500 kcal/day - meets 100% of estimated needs Estimated protein intake: 1.6 g/kg/day - meets 160-200% of estimated needs Estimated fluid intake: 1570 mL/day - meets 104% of estimated needs  Micronutrient Intake  Vitamin A 777 mcg  Vitamin C 459 mg  Vitamin D 15 mcg  Vitamin E 12.1 mg  Vitamin K 125.4 mcg  Vitamin B1 (thiamin) 1.2 mg  Vitamin B2 (riboflavin) 1.3 mg  Vitamin B3 (niacin) 19 mg  Vitamin B5 (pantothenic acid) 5 mg  Vitamin B6 1.7 mg  Vitamin B7 (biotin) 195 mcg  Vitamin B9 (folate) 333 mcg  Vitamin B12 4.5 mcg  Choline 441.9 mg  Calcium 1050 mg  Chromium 30 mcg  Copper 1080 mcg  Fluoride 0 mg  Iodine 156 mcg  Iron 21.6 mg  Magnesium 472.2 mg  Manganese 3.6 mg  Molybdenum 0 mcg  Phosphorous 679.2 mg  Selenium 52.2 mcg  Zinc 12 mg  Potassium 3180 mg  Sodium 1620 mg  Chloride 0 mg  Fiber 33 g   Nutrition Diagnosis: (1/12) Inadequate oral intake related to medical condition as evidenced by pt dependent on Gtube feedings to meet nutritional needs.  Intervention: Discussed pt's current regimen. Discussed recommendations below. All questions answered, family in agreement with plan.   Nutrition Recommendations: - I would recommend starting a liquid adult multivitamin to ensure Nguyen is receiving all micronutrients need (Centrum Adult Liquid Multivitamin or Mary Ruth's Liquid Vitamin). - Continue current tube feeding regimen. Reach out if you do have any concerns about weight gain and we could always do some of the regular Liquid Hope as well.   Teach back method  used.  Monitoring/Evaluation: Continue to Monitor: - Weight trends  - TF tolerance  - Need for further decrease in calories (Liquid Hope and Liquid Hope Peptide mixture)  Follow-up scheduled for February 6th @ 1:30 PM, joint with Elveria Rising, NP  Total time spent in counseling: 60 minutes.

## 2022-08-31 ENCOUNTER — Encounter: Payer: Self-pay | Admitting: Pediatrics

## 2022-08-31 NOTE — Progress Notes (Signed)
   History of Present Illness This is a 27 yo male with  1.developmental delay 2.G tube feeds 3. seizures  who presents for this annual check  Main concerns today are: Needs new seat for wheelchair Play therapy for OT/PT GI--appointment made with [facility] Needs a referral to [facility] for dental care -routine or try [facility] Dental Needs Tdap today---deferred in the past  Screening labs for adult care--CBC, CMP, TSH, Free T4, and Lipid profile.  Dental cleaning --[facility] --needs sedation for cleaning    Developmental History Developmental delay Seizures Spastic quadriplegia   Function: Mobility: manual wheelchair Pain concerns: no Hand function: Right: reduced dexterity Left: reduced dexterity Spine curvature: mild Swallowing: normal and modified diet (pureed) Toileting: dependent   Equipment:  Wheelchair AFO's Hand splints Print production planner out chair Suction tubes and suction machine Nebulizer Pulse ox Oxygen tubes and tank Diapers Wipes and gloves G -tube button with Tubings/stand  DIET--Liquid Hope 1 pouch 3.5 times a day--579mls  Oral feeds--Pureed--NECTAR Not much taken by mouth   Specialists- Neuro-DR [facility]--has an appointment with [facility] ENT --n/a Ortho-n/a Pulmonary-N/A Cardio--N/A Endocrinology--N/A Dental--for referral  Ophthal--seen  Urology--N/A Surgeon--N/A   Review of Systems: stable  The following portions of the patient's history were reviewed and updated as appropriate: allergies, current medications, past family history, past medical history, past social history, past surgical history and problem list.  Referrals needed--DENTAL   Dentist for special needs   Objective:    Physical Exam  Cognition: non-interactive HEENT --normal  [employer] ---No murmurs Respiratory: normal, no increased effort Lower extremity function:AFO's Abdomen: normal-- G tube present Spine scoliosis: mild --post surgery Sitting  Ability: assisted Gait: wheelchair    Assessment:    Annual physical exam   Plan:    1. Gross motor: delayed 2. Fine motor/ADL: delayed 3. Educational/vocational:n/a 4. Transition skills: n/a 5. Speech/swallowing: no speech, GERD 6. Orthopedics/bracing: bilateral AFO's --present today 7. Other equipment:  bath chair, gait trainer, hand/wrist splint(s), life system, stander, walker, wheelchair and RAMP for house.  For Tdap vaccine  Labs to be drawn.CBC, CMP, TSH, Free T4, and Lipid profile.

## 2022-08-31 NOTE — Patient Instructions (Signed)
Spasticity Spasticity is a condition in which your muscles contract suddenly and unpredictably (spasm). Spasticity usually affects your arms, legs, or back. It can also affect the way you walk. Spasticity can range from mild muscle stiffness and tightness to severe, uncontrollable muscle spasms. Severe spasticity can be painful and can freeze your muscles in an uncomfortable position. Follow these instructions at home: Managing muscle stiffness and spasms     Wear a brace as told by your health care provider to prevent muscle contractions. Have the affected muscles massaged. If directed, apply heat to the affected muscle area. Use the heat source that your health care provider recommends, such as a moist heat pack or heating pad. Place a towel between your skin and the heat source. Leave the heat on for 20-30 minutes. Remove the heat if your skin turns bright red. This is especially important if you are unable to feel pain, heat, or cold. You may have a greater risk of getting burned. If directed, apply ice to the affected muscle area: Put ice in a plastic bag. Place a towel between your skin and the bag or between your brace and the bag. Leave the ice on for 20 minutes, 2?3 times a day. Activity Stay active as directed by your health care provider. Find a safe exercise program that fits your needs and ability. Maintain good posture when walking and sitting. Work with a physical therapist to learn exercises that will stretch and strengthen your muscles. Do stretching and range-of-motion exercises at home as told by a physical therapist. Work with an occupational therapist. This type of health care provider can help you function better at home and at work. If you have severe spasticity, use mobility aids, such as a walker or cane, as told by your health care provider. General instructions Watch your condition for any changes. Wear loose, comfortable clothing that does not restrict your  movement. Wear closed-toe shoes that fit well and support your feet. Wear shoes that have rubber soles or low heels. Take over-the-counter and prescription medicine only as told by your health care provider. Keep all follow-up visits as told by your health care provider. This is important. Contact a health care provider if you: Have worsening muscle spasms. Develop other symptoms along with spasticity. Have a fever or chills. Experience a burning feeling when you pass urine. Become constipated. Need more support at home. Get help right away if you: Have trouble breathing. Have a muscle spasm that freezes you into a painful position. Cannot walk. Cannot care for yourself at home. Have trouble passing urine or have urinary incontinence. Summary Spasticity is a condition in which your muscles contract suddenly and unpredictably (spasm). Spasticity usually affects your arms, legs, or back. Spasticity can range from mild muscle stiffness and tightness to severe, uncontrollable muscle spasms. Do stretching and range-of-motion exercises at home as told by a physical therapist. Take over-the-counter and prescription medicine only as told by your health care provider. This information is not intended to replace advice given to you by your health care provider. Make sure you discuss any questions you have with your health care provider. Document Revised: 12/24/2021 Document Reviewed: 12/24/2021 Elsevier Patient Education  2024 [employer].

## 2022-09-06 ENCOUNTER — Ambulatory Visit (INDEPENDENT_AMBULATORY_CARE_PROVIDER_SITE_OTHER): Payer: Self-pay | Admitting: Pediatrics

## 2022-09-06 DIAGNOSIS — Z0000 Encounter for general adult medical examination without abnormal findings: Secondary | ICD-10-CM

## 2022-09-09 NOTE — Progress Notes (Signed)
[name] is here for blood work today. CMA attempted lab draw, was unsuccessful.
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
## 2022-09-11 NOTE — Patient Instructions (Incomplete)
It was a pleasure to see you today!  Instructions for you until your next appointment are as follows: Reduce [name]'s Baclofen to every other day for the next few days. If he tolerates that well. Stop the Baclofen.  Continue [name]'s feedings and other medications as prescribed Please sign up for MyChart if you have not done so. Please plan to return for follow up in February or sooner if needed.  Feel free to contact our office during normal business hours at [phone] with questions or concerns. If there is no answer or the call is outside business hours, please leave a message and our clinic staff will call you back within the next business day.  If you have an urgent concern, please stay on the line for our after-hours answering service and ask for the on-call neurologist.     I also encourage you to use MyChart to communicate with me more directly. If you have not yet signed up for MyChart within [facility], the front desk staff can help you. However, please note that this inbox is NOT monitored on nights or weekends, and response can take up to 2 business days.  Urgent matters should be discussed with the on-call pediatric neurologist.   At [facility], we are committed to providing exceptional care. You will receive a patient satisfaction survey through text or email regarding your visit today. Your opinion is important to me. Comments are appreciated.

## 2022-09-12 ENCOUNTER — Ambulatory Visit (INDEPENDENT_AMBULATORY_CARE_PROVIDER_SITE_OTHER): Payer: BC Managed Care – PPO | Admitting: Dietician

## 2022-09-12 ENCOUNTER — Encounter (INDEPENDENT_AMBULATORY_CARE_PROVIDER_SITE_OTHER): Payer: Self-pay | Admitting: Family

## 2022-09-12 ENCOUNTER — Ambulatory Visit (INDEPENDENT_AMBULATORY_CARE_PROVIDER_SITE_OTHER): Payer: BC Managed Care – PPO | Admitting: Family

## 2022-09-12 VITALS — BP 110/70 | HR 88 | Ht 60.93 in

## 2022-09-12 DIAGNOSIS — R638 Other symptoms and signs concerning food and fluid intake: Secondary | ICD-10-CM

## 2022-09-12 DIAGNOSIS — G40219 Localization-related (focal) (partial) symptomatic epilepsy and epileptic syndromes with complex partial seizures, intractable, without status epilepticus: Secondary | ICD-10-CM | POA: Diagnosis not present

## 2022-09-12 DIAGNOSIS — G40309 Generalized idiopathic epilepsy and epileptic syndromes, not intractable, without status epilepticus: Secondary | ICD-10-CM | POA: Diagnosis not present

## 2022-09-12 DIAGNOSIS — G8250 Quadriplegia, unspecified: Secondary | ICD-10-CM

## 2022-09-12 DIAGNOSIS — Z931 Gastrostomy status: Secondary | ICD-10-CM

## 2022-09-12 DIAGNOSIS — F88 Other disorders of psychological development: Secondary | ICD-10-CM

## 2022-09-12 DIAGNOSIS — M4145 Neuromuscular scoliosis, thoracolumbar region: Secondary | ICD-10-CM

## 2022-09-12 DIAGNOSIS — Q043 Other reduction deformities of brain: Secondary | ICD-10-CM

## 2022-09-12 DIAGNOSIS — Q939 Deletion from autosomes, unspecified: Secondary | ICD-10-CM

## 2022-09-12 NOTE — Patient Instructions (Addendum)
Nutrition Recommendations: - I would recommend starting a liquid adult multivitamin to ensure Mikiel is receiving all micronutrients need (Centrum Adult Liquid Multivitamin or Mary Ruth's Liquid Vitamin). - Continue current tube feeding regimen. Reach out if you do have any concerns about weight gain and we could always do some of the regular Liquid Hope as well.   Follow-up scheduled for February 6th @ 1:30 PM, joint with Elveria Rising, NP

## 2022-09-14 ENCOUNTER — Encounter (INDEPENDENT_AMBULATORY_CARE_PROVIDER_SITE_OTHER): Payer: Self-pay | Admitting: Family

## 2022-09-17 ENCOUNTER — Other Ambulatory Visit (INDEPENDENT_AMBULATORY_CARE_PROVIDER_SITE_OTHER): Payer: Self-pay | Admitting: Pediatrics

## 2022-09-17 DIAGNOSIS — G40309 Generalized idiopathic epilepsy and epileptic syndromes, not intractable, without status epilepticus: Secondary | ICD-10-CM

## 2022-11-25 ENCOUNTER — Other Ambulatory Visit (INDEPENDENT_AMBULATORY_CARE_PROVIDER_SITE_OTHER): Payer: Self-pay | Admitting: Pediatrics

## 2022-11-25 DIAGNOSIS — G40309 Generalized idiopathic epilepsy and epileptic syndromes, not intractable, without status epilepticus: Secondary | ICD-10-CM

## 2022-11-26 NOTE — Telephone Encounter (Signed)
Last OV 09/12/2022 Next OV 02/20/2023 Last Rx 05/09/2022  RF5 Last OV note reports 1000 mg BID as current dose- Rx is for 15 ml BID Pharm request is for 10 ml BID.  Request sent to NP to confirm correct dose

## 2022-12-25 ENCOUNTER — Encounter (INDEPENDENT_AMBULATORY_CARE_PROVIDER_SITE_OTHER): Payer: Self-pay

## 2023-02-14 ENCOUNTER — Telehealth: Payer: Self-pay | Admitting: Pediatrics

## 2023-02-14 DIAGNOSIS — Z0000 Encounter for general adult medical examination without abnormal findings: Secondary | ICD-10-CM

## 2023-02-14 NOTE — Telephone Encounter (Signed)
Mother called stating they were recently seen in office 09/06/22 for lab work and were unable to complete the blood draw. Mother stated they were supposed to receive a phone call with an alternate location to take patient to complete lab work. Spoke with Aron Baba, CMA, and informed Mother we would speak with Dr. Barney Drain, MD, and give a phone call on Monday morning. Mother understood and agreed.

## 2023-02-17 NOTE — Telephone Encounter (Signed)
Labs ordered and emailed to mom to take to [facility] for lab draw

## 2023-02-20 ENCOUNTER — Ambulatory Visit (INDEPENDENT_AMBULATORY_CARE_PROVIDER_SITE_OTHER): Payer: BC Managed Care – PPO | Admitting: Family

## 2023-02-20 ENCOUNTER — Ambulatory Visit (INDEPENDENT_AMBULATORY_CARE_PROVIDER_SITE_OTHER): Payer: Self-pay | Admitting: Dietician

## 2023-02-20 VITALS — BP 114/70 | HR 84 | Wt 107.0 lb

## 2023-02-20 DIAGNOSIS — M4145 Neuromuscular scoliosis, thoracolumbar region: Secondary | ICD-10-CM

## 2023-02-20 DIAGNOSIS — F88 Other disorders of psychological development: Secondary | ICD-10-CM

## 2023-02-20 DIAGNOSIS — Z931 Gastrostomy status: Secondary | ICD-10-CM

## 2023-02-20 DIAGNOSIS — G40219 Localization-related (focal) (partial) symptomatic epilepsy and epileptic syndromes with complex partial seizures, intractable, without status epilepticus: Secondary | ICD-10-CM

## 2023-02-20 DIAGNOSIS — Q939 Deletion from autosomes, unspecified: Secondary | ICD-10-CM

## 2023-02-20 DIAGNOSIS — G8250 Quadriplegia, unspecified: Secondary | ICD-10-CM

## 2023-02-20 DIAGNOSIS — Q043 Other reduction deformities of brain: Secondary | ICD-10-CM

## 2023-02-20 DIAGNOSIS — G40309 Generalized idiopathic epilepsy and epileptic syndromes, not intractable, without status epilepticus: Secondary | ICD-10-CM

## 2023-02-20 NOTE — Patient Instructions (Addendum)
 It was a pleasure to see you today! I am pleased to see Leor doing so well.   Instructions for you until your next appointment are as follows: Continue Emin's medications as prescribed Try substituting one of the Liquid Hope regular pouches once per day for the Liquid Hope Peptide pouches Be sure to keep the dental appointment at [facility] as scheduled I will send updated Community Support form to [place].  Please sign up for MyChart if you have not done so. Please plan to return for follow up in 3 months or sooner if needed.  Feel free to contact our office during normal business hours at [phone] with questions or concerns. If there is no answer or the call is outside business hours, please leave a message and our clinic staff will call you back within the next business day.  If you have an urgent concern, please stay on the line for our after-hours answering service and ask for the on-call neurologist.     I also encourage you to use MyChart to communicate with me more directly. If you have not yet signed up for MyChart within [facility], the front desk staff can help you. However, please note that this inbox is NOT monitored on nights or weekends, and response can take up to 2 business days.  Urgent matters should be discussed with the on-call pediatric neurologist.   At [facility], we are committed to providing exceptional care. You will receive a patient satisfaction survey through text or email regarding your visit today. Your opinion is important to me. Comments are appreciated.
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
## 2023-02-21 ENCOUNTER — Encounter (INDEPENDENT_AMBULATORY_CARE_PROVIDER_SITE_OTHER): Payer: Self-pay | Admitting: Family

## 2023-03-04 ENCOUNTER — Telehealth: Payer: Self-pay | Admitting: Pediatrics

## 2023-03-04 NOTE — Telephone Encounter (Signed)
 Letter for 3 extension sets per month

## 2023-04-14 ENCOUNTER — Telehealth (INDEPENDENT_AMBULATORY_CARE_PROVIDER_SITE_OTHER): Payer: Self-pay | Admitting: Family

## 2023-04-14 DIAGNOSIS — Z931 Gastrostomy status: Secondary | ICD-10-CM

## 2023-04-14 NOTE — Telephone Encounter (Signed)
 Who's calling (name and relationship to patient) : [name]; mom aksi   Best contact number: [phone]  Provider they see: Elveria Rising, NP   Reason for call: Mom called  stating that [name] is running  low on supplies. She is needing Inetta Fermo to send an order in to [place]. She stated Leretha Pol is waiting for Inetta Fermo.    Call ID:      PRESCRIPTION REFILL ONLY  Name of prescription:  Pharmacy:

## 2023-04-15 NOTE — Telephone Encounter (Signed)
 Mom sent email stating: Leretha Pol needs a prescription to send [name] food, since it was changed.  He needs one pouch of Liquid Hope Peptide formula daily and two Original formula Liquid Hope daily for tube feeding.   [phone]- Leretha Pol

## 2023-04-16 NOTE — Telephone Encounter (Signed)
 Order sent to [facility] as requested. TG

## 2023-05-20 ENCOUNTER — Telehealth (INDEPENDENT_AMBULATORY_CARE_PROVIDER_SITE_OTHER): Payer: Self-pay | Admitting: Family

## 2023-05-20 DIAGNOSIS — Z931 Gastrostomy status: Secondary | ICD-10-CM

## 2023-05-20 DIAGNOSIS — K5901 Slow transit constipation: Secondary | ICD-10-CM

## 2023-05-20 MED ORDER — POLYETHYLENE GLYCOL 3350 17 G PO PACK: 17.0000 g | PACK | Freq: Every day | ORAL | 5 refills | Status: AC
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
## 2023-05-21 ENCOUNTER — Ambulatory Visit (INDEPENDENT_AMBULATORY_CARE_PROVIDER_SITE_OTHER): Payer: BC Managed Care – PPO | Admitting: Family

## 2023-06-26 ENCOUNTER — Other Ambulatory Visit (INDEPENDENT_AMBULATORY_CARE_PROVIDER_SITE_OTHER): Payer: Self-pay

## 2023-06-26 DIAGNOSIS — G40309 Generalized idiopathic epilepsy and epileptic syndromes, not intractable, without status epilepticus: Secondary | ICD-10-CM

## 2023-06-26 MED ORDER — LEVETIRACETAM 100 MG/ML PO SOLN
ORAL | 0 refills | Status: DC
Start: 2023-06-26 — End: 2023-07-26

## 2023-07-26 ENCOUNTER — Encounter (INDEPENDENT_AMBULATORY_CARE_PROVIDER_SITE_OTHER): Payer: Self-pay | Admitting: Pediatrics

## 2023-07-26 DIAGNOSIS — G40309 Generalized idiopathic epilepsy and epileptic syndromes, not intractable, without status epilepticus: Secondary | ICD-10-CM

## 2023-07-26 MED ORDER — LEVETIRACETAM 100 MG/ML PO SOLN: ORAL | 0 refills | Status: DC

## 2023-08-04 ENCOUNTER — Ambulatory Visit (INDEPENDENT_AMBULATORY_CARE_PROVIDER_SITE_OTHER): Admitting: Family

## 2023-08-21 ENCOUNTER — Telehealth: Payer: Self-pay | Admitting: Pediatrics

## 2023-08-21 ENCOUNTER — Other Ambulatory Visit: Payer: Self-pay | Admitting: Pediatrics

## 2023-08-21 MED ORDER — LANSOPRAZOLE 30 MG PO TBDD: DELAYED_RELEASE_TABLET | ORAL | 12 refills | Status: DC

## 2023-08-21 NOTE — Telephone Encounter (Signed)
 Pt mom called in and stated [facility] faxed over request for updated prescription lansoprazole . Mom stated pt is out of meds and requesting PCP send over prescription.  If need to reach mom best ph# [phone]

## 2023-08-21 NOTE — Progress Notes (Signed)
 Ordered refill of prevacid 

## 2023-08-21 NOTE — Telephone Encounter (Signed)
 Refills sent

## 2023-08-26 ENCOUNTER — Encounter (INDEPENDENT_AMBULATORY_CARE_PROVIDER_SITE_OTHER): Payer: Self-pay | Admitting: Family

## 2023-08-26 ENCOUNTER — Other Ambulatory Visit (INDEPENDENT_AMBULATORY_CARE_PROVIDER_SITE_OTHER): Payer: Self-pay | Admitting: Pediatrics

## 2023-08-26 ENCOUNTER — Telehealth (INDEPENDENT_AMBULATORY_CARE_PROVIDER_SITE_OTHER): Payer: Self-pay | Admitting: Family

## 2023-08-26 DIAGNOSIS — G40309 Generalized idiopathic epilepsy and epileptic syndromes, not intractable, without status epilepticus: Secondary | ICD-10-CM

## 2023-08-26 NOTE — Telephone Encounter (Signed)
 Attempted to contact patients mother.  Mother unable to be reached.  Left HIPAA compliant VM.   SS, CCMA

## 2023-08-26 NOTE — Telephone Encounter (Signed)
  Name of who is calling: Olivia Millard Relationship to Patient: mom   Best contact number: [phone]  Provider they see: Ellouise   Reason for call: Mom called stating that she has not been able to get in contact with pharmacy regarding refill. She states they are leaving tomorrow morning before 11am to go out of town and just wants to make sure that they will have medication refill. Would like a call back to confirm.      PRESCRIPTION REFILL ONLY  Name of prescription: keppra    Pharmacy: [facility] [address]

## 2023-08-26 NOTE — Telephone Encounter (Signed)
 Mom called back.  I informed mom that the refill was sent earlier today.   Mom verbalized understanding of this.   SS, CCMA

## 2023-09-01 ENCOUNTER — Ambulatory Visit: Admitting: Pediatrics

## 2023-09-09 ENCOUNTER — Encounter (INDEPENDENT_AMBULATORY_CARE_PROVIDER_SITE_OTHER): Payer: Self-pay | Admitting: Family

## 2023-09-09 ENCOUNTER — Ambulatory Visit (INDEPENDENT_AMBULATORY_CARE_PROVIDER_SITE_OTHER): Admitting: Family

## 2023-09-09 VITALS — BP 107/70 | HR 100 | Wt 107.0 lb

## 2023-09-09 DIAGNOSIS — G40309 Generalized idiopathic epilepsy and epileptic syndromes, not intractable, without status epilepticus: Secondary | ICD-10-CM

## 2023-09-09 DIAGNOSIS — K5901 Slow transit constipation: Secondary | ICD-10-CM | POA: Diagnosis not present

## 2023-09-09 DIAGNOSIS — G8250 Quadriplegia, unspecified: Secondary | ICD-10-CM

## 2023-09-09 DIAGNOSIS — Z931 Gastrostomy status: Secondary | ICD-10-CM

## 2023-09-09 DIAGNOSIS — Q043 Other reduction deformities of brain: Secondary | ICD-10-CM

## 2023-09-09 DIAGNOSIS — G40219 Localization-related (focal) (partial) symptomatic epilepsy and epileptic syndromes with complex partial seizures, intractable, without status epilepticus: Secondary | ICD-10-CM | POA: Diagnosis not present

## 2023-09-09 DIAGNOSIS — F88 Other disorders of psychological development: Secondary | ICD-10-CM

## 2023-09-09 DIAGNOSIS — M4145 Neuromuscular scoliosis, thoracolumbar region: Secondary | ICD-10-CM

## 2023-09-09 DIAGNOSIS — Q939 Deletion from autosomes, unspecified: Secondary | ICD-10-CM

## 2023-09-09 MED ORDER — LEVETIRACETAM 100 MG/ML PO SOLN
ORAL | 5 refills | Status: AC
Start: 2023-09-09 — End: ?
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
## 2023-09-09 NOTE — Patient Instructions (Signed)
 It was a pleasure to see you today!  Instructions for you until your next appointment are as follows: Continue Lonnie's feedings and medications as prescribed I will complete the Community Support and send in for you Call for any questions or concerns Please sign up for MyChart if you have not done so. Please plan to return for follow up in 8 months or sooner if needed.  Feel free to contact our office during normal business hours at [phone] with questions or concerns. If there is no answer or the call is outside business hours, please leave a message and our clinic staff will call you back within the next business day.  If you have an urgent concern, please stay on the line for our after-hours answering service and ask for the on-call neurologist.     I also encourage you to use MyChart to communicate with me more directly. If you have not yet signed up for MyChart within [facility], the front desk staff can help you. However, please note that this inbox is NOT monitored on nights or weekends, and response can take up to 2 business days.  Urgent matters should be discussed with the on-call pediatric neurologist.   At [facility], we are committed to providing exceptional care. You will receive a patient satisfaction survey through text or email regarding your visit today. Your opinion is important to me. Comments are appreciated.

## 2023-10-09 ENCOUNTER — Telehealth (INDEPENDENT_AMBULATORY_CARE_PROVIDER_SITE_OTHER): Payer: Self-pay | Admitting: Family

## 2023-10-09 ENCOUNTER — Ambulatory Visit (INDEPENDENT_AMBULATORY_CARE_PROVIDER_SITE_OTHER): Admitting: Pediatrics

## 2023-10-09 ENCOUNTER — Other Ambulatory Visit: Payer: Self-pay | Admitting: Pediatrics

## 2023-10-09 ENCOUNTER — Encounter: Payer: Self-pay | Admitting: Pediatrics

## 2023-10-09 VITALS — BP 118/74 | Ht 64.0 in | Wt 107.0 lb

## 2023-10-09 DIAGNOSIS — Z931 Gastrostomy status: Secondary | ICD-10-CM | POA: Diagnosis not present

## 2023-10-09 DIAGNOSIS — F88 Other disorders of psychological development: Secondary | ICD-10-CM

## 2023-10-09 DIAGNOSIS — Z0000 Encounter for general adult medical examination without abnormal findings: Secondary | ICD-10-CM | POA: Diagnosis not present

## 2023-10-09 DIAGNOSIS — G40219 Localization-related (focal) (partial) symptomatic epilepsy and epileptic syndromes with complex partial seizures, intractable, without status epilepticus: Secondary | ICD-10-CM

## 2023-10-09 DIAGNOSIS — Z68.41 Body mass index (BMI) pediatric, 5th percentile to less than 85th percentile for age: Secondary | ICD-10-CM

## 2023-10-09 DIAGNOSIS — G8250 Quadriplegia, unspecified: Secondary | ICD-10-CM

## 2023-10-09 DIAGNOSIS — M4145 Neuromuscular scoliosis, thoracolumbar region: Secondary | ICD-10-CM

## 2023-10-09 DIAGNOSIS — G800 Spastic quadriplegic cerebral palsy: Secondary | ICD-10-CM

## 2023-10-09 DIAGNOSIS — Q939 Deletion from autosomes, unspecified: Secondary | ICD-10-CM | POA: Diagnosis not present

## 2023-10-09 MED ORDER — LANSOPRAZOLE 30 MG PO TBDD: DELAYED_RELEASE_TABLET | ORAL | 12 refills | Status: DC

## 2023-10-09 NOTE — Patient Instructions (Signed)
Spasticity Spasticity is a condition in which your muscles contract suddenly and unpredictably (spasm). Spasticity usually affects your arms, legs, or back. It can also affect the way you walk. Spasticity can range from mild muscle stiffness and tightness to severe, uncontrollable muscle spasms. Severe spasticity can be painful and can freeze your muscles in an uncomfortable position. Follow these instructions at home: Managing muscle stiffness and spasms     Wear a brace as told by your health care provider to prevent muscle contractions. Have the affected muscles massaged. If directed, apply heat to the affected muscle area. Use the heat source that your health care provider recommends, such as a moist heat pack or heating pad. Place a towel between your skin and the heat source. Leave the heat on for 20-30 minutes. Remove the heat if your skin turns bright red. This is especially important if you are unable to feel pain, heat, or cold. You may have a greater risk of getting burned. If directed, apply ice to the affected muscle area: Put ice in a plastic bag. Place a towel between your skin and the bag or between your brace and the bag. Leave the ice on for 20 minutes, 2?3 times a day. Activity Stay active as directed by your health care provider. Find a safe exercise program that fits your needs and ability. Maintain good posture when walking and sitting. Work with a physical therapist to learn exercises that will stretch and strengthen your muscles. Do stretching and range-of-motion exercises at home as told by a physical therapist. Work with an occupational therapist. This type of health care provider can help you function better at home and at work. If you have severe spasticity, use mobility aids, such as a walker or cane, as told by your health care provider. General instructions Watch your condition for any changes. Wear loose, comfortable clothing that does not restrict your  movement. Wear closed-toe shoes that fit well and support your feet. Wear shoes that have rubber soles or low heels. Take over-the-counter and prescription medicine only as told by your health care provider. Keep all follow-up visits as told by your health care provider. This is important. Contact a health care provider if you: Have worsening muscle spasms. Develop other symptoms along with spasticity. Have a fever or chills. Experience a burning feeling when you pass urine. Become constipated. Need more support at home. Get help right away if you: Have trouble breathing. Have a muscle spasm that freezes you into a painful position. Cannot walk. Cannot care for yourself at home. Have trouble passing urine or have urinary incontinence. Summary Spasticity is a condition in which your muscles contract suddenly and unpredictably (spasm). Spasticity usually affects your arms, legs, or back. Spasticity can range from mild muscle stiffness and tightness to severe, uncontrollable muscle spasms. Do stretching and range-of-motion exercises at home as told by a physical therapist. Take over-the-counter and prescription medicine only as told by your health care provider. This information is not intended to replace advice given to you by your health care provider. Make sure you discuss any questions you have with your health care provider. Document Revised: 12/24/2021 Document Reviewed: 12/24/2021 Elsevier Patient Education  2024 [employer].

## 2023-10-09 NOTE — Telephone Encounter (Signed)
 Dr Montel contacted me with concern about [name]'s g-tube. I called Mom who said that the g-tube was loose and that the stoma leaked at times. I scheduled an appointment for him to come in for me to assess the g-tube site.

## 2023-10-09 NOTE — Progress Notes (Addendum)
° °  Diapers    History of Present Illness This is a 28 yo male who presents for yearly check Issues : 1.developmental delay 2.G tube feeds 3. seizures  4. Requires oxygen  use and this is a continuous need  5. Has been seen by Special care and documented pulse ox below 88% on room air thus the need for supplemental oxygen .  Main concerns today are: Play therapy for OT/PT GI--needs a bigger G tube ---will contact Ellouise from peds subspecialty Screening labs for adult care--CBC, CMP, TSH, Free T4, and Lipid profile.   Developmental History Developmental delay Seizures Spastic quadriplegia   Function: Mobility: manual wheelchair Pain concerns: no Hand function: Right: reduced dexterity Left: reduced dexterity Spine curvature: mild Swallowing: normal and modified diet (pureed) Toileting: dependent   Equipment:  Wheelchair AFO's Hand splints Print Production Planner out chair Suction tubes and suction machine Nebulizer Pulse ox Oxygen  tubes and tank Diapers Wipes and gloves G -tube button with Tubings/stand  DIET--Liquid Hope ---3 pouches --2 peptide and one original per day (Liquid hope)--[facility] in [place]  Oral feeds--Pureed--NECTAR Not much taken by mouth   Specialists- [facility]--has an appointment with [facility] ENT --n/a Ortho-n/a Pulmonary-N/A Cardio--N/A Endocrinology--N/A Dental--for referral  Ophthal--seen  Urology--N/A Surgeon--N/A   Review of Systems: stable  The following portions of the patient's history were reviewed and updated as appropriate: allergies, current medications, past family history, past medical history, past social history, past surgical history and problem list.   Objective:    Physical Exam  Cognition: non-interactive HEENT --normal  [employer] ---No murmurs Respiratory: normal, no increased effort Lower extremity function:AFO's Abdomen: normal-- G tube present Spine scoliosis: mild --post  surgery Sitting Ability: assisted Gait: wheelchair    Assessment:    Annual physical exam   Plan:    1. Gross motor: delayed 2. Fine motor/ADL: delayed 3. Educational/vocational:n/a 4. Transition skills: n/a 5. Speech/swallowing: no speech, GERD 6. Orthopedics/bracing: bilateral AFO's --present today 7. Other equipment:  bath chair, gait trainer, hand/wrist splint(s), life system, stander, walker, wheelchair and RAMP for house. Handicap Sticker application  Labs to be drawn.CBC, CMP, TSH, Free T4, and Lipid profile.  Requires oxygen  use and this is a continuous need  Has been seen by Special care and documented pulse ox below 88% on room air thus the need for supplemental oxygen .

## 2023-10-13 ENCOUNTER — Telehealth: Payer: Self-pay

## 2023-10-13 NOTE — Telephone Encounter (Signed)
 Pharmacist called as concerning medication lansoprazole  as there is a question of prescription telling patient to dissolve pills into liquid and store in fridge. Concern is it looks as if patient was told they can do for all pill and store for when ready for use.   Pharmacist called and explained mother is aware of how to administer medication as she has been giving this medication for a while. This is a new medication prescribed form Dr. Ramgoolam but it has been prescribed many times from GI. Pharmacist updated and okay with response.   Phone call closed and signed off.

## 2023-10-14 ENCOUNTER — Ambulatory Visit (INDEPENDENT_AMBULATORY_CARE_PROVIDER_SITE_OTHER): Payer: Self-pay | Admitting: Family
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
## 2023-10-15 ENCOUNTER — Encounter (INDEPENDENT_AMBULATORY_CARE_PROVIDER_SITE_OTHER): Payer: Self-pay | Admitting: Family

## 2023-10-15 ENCOUNTER — Ambulatory Visit (INDEPENDENT_AMBULATORY_CARE_PROVIDER_SITE_OTHER): Admitting: Family

## 2023-10-15 VITALS — BP 112/74 | HR 116 | Wt 104.0 lb

## 2023-10-15 DIAGNOSIS — Z431 Encounter for attention to gastrostomy: Secondary | ICD-10-CM | POA: Insufficient documentation

## 2023-10-15 DIAGNOSIS — Z931 Gastrostomy status: Secondary | ICD-10-CM

## 2023-10-15 DIAGNOSIS — K9429 Other complications of gastrostomy: Secondary | ICD-10-CM

## 2023-10-15 DIAGNOSIS — G800 Spastic quadriplegic cerebral palsy: Secondary | ICD-10-CM

## 2023-10-15 DIAGNOSIS — M4145 Neuromuscular scoliosis, thoracolumbar region: Secondary | ICD-10-CM

## 2023-10-15 DIAGNOSIS — Q939 Deletion from autosomes, unspecified: Secondary | ICD-10-CM

## 2023-10-15 DIAGNOSIS — F88 Other disorders of psychological development: Secondary | ICD-10-CM

## 2023-10-15 MED ORDER — NYSTATIN 100000 UNIT/GM EX CREA: TOPICAL_CREAM | CUTANEOUS | 0 refills | Status: DC

## 2023-10-15 MED ORDER — NYSTATIN 100000 UNIT/GM EX POWD: CUTANEOUS | 0 refills | Status: DC
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
## 2023-10-15 NOTE — Patient Instructions (Signed)
 It was a pleasure to see you today! The g-tube was changed today. [name] has a 14Fr 2.5cm AMT MiniOne balloon button in place. There is 4ml of water in the balloon.  Instructions for you until your next appointment are as follows: Let me know if you feel like the fit of this g-tube is better than the previous one Apply a thin layer of Nystatin cream, followed by a thin layer of Nystatin powder to the reddened skin under the g-tube. You can stop when the redness and irritation resolves I will send an updated order for the Nystatin to [facility] Continue feedings and medications as prescribed Please sign up for MyChart if you have not done so. Please plan to return for follow up in 3 months or sooner if needed.  Feel free to contact our office during normal business hours at [phone] with questions or concerns. If there is no answer or the call is outside business hours, please leave a message and our clinic staff will call you back within the next business day.  If you have an urgent concern, please stay on the line for our after-hours answering service and ask for the on-call neurologist.     I also encourage you to use MyChart to communicate with me more directly. If you have not yet signed up for MyChart within [facility], the front desk staff can help you. However, please note that this inbox is NOT monitored on nights or weekends, and response can take up to 2 business days.  Urgent matters should be discussed with the on-call pediatric neurologist.   At [facility], we are committed to providing exceptional care. You will receive a patient satisfaction survey through text or email regarding your visit today. Your opinion is important to me. Comments are appreciated.

## 2023-10-21 ENCOUNTER — Telehealth: Payer: Self-pay | Admitting: Pediatrics

## 2023-10-21 NOTE — Telephone Encounter (Signed)
 Standard drug request form faxed to [phone], received SUCCESS, placed in dated 7 folder

## 2023-10-21 NOTE — Telephone Encounter (Signed)
 Melissa, a clinical pharmacist called in regard to recent prior authorization request. Pharmacist states the patient has been given thecompounded version of lansoprazole  from [facility] for the past 10 months. Pharmacist states she needs verbal confirmation/documentation explaining the need to change from the compounded version to the ODT tablets. Pharmacist states they need reasoning as to why the compound is no longer appropriate for patient before they are able to proceed.    For further communication, Melissa (Clinical Pharmacist) can be reached at [phone]

## 2023-10-22 NOTE — Telephone Encounter (Signed)
 Called and spoke to [place] --claim denied but [facility] was able to compound it at [facility] cop[facility]

## 2023-10-24 ENCOUNTER — Other Ambulatory Visit (INDEPENDENT_AMBULATORY_CARE_PROVIDER_SITE_OTHER): Payer: Self-pay | Admitting: Family

## 2023-10-24 NOTE — Telephone Encounter (Signed)
  Name of who is calling: michele   Caller's Relationship to Patient: mother   Best contact number: [phone]  Provider they see: tina   Reason for call: rx refill      PRESCRIPTION REFILL ONLY  Name of prescription: nayzilam    Pharmacy: [facility]

## 2023-10-27 MED ORDER — NAYZILAM 5 MG/0.1ML NA SOLN: 5.0000 mg | NASAL | 3 refills | Status: AC | PRN

## 2023-11-19 ENCOUNTER — Encounter (INDEPENDENT_AMBULATORY_CARE_PROVIDER_SITE_OTHER): Payer: Self-pay

## 2023-12-09 ENCOUNTER — Telehealth (INDEPENDENT_AMBULATORY_CARE_PROVIDER_SITE_OTHER): Payer: Self-pay | Admitting: Family

## 2023-12-09 NOTE — Telephone Encounter (Signed)
 Most recent office note faxed to [facility]   SS, CCMA

## 2023-12-09 NOTE — Telephone Encounter (Signed)
  Name of who is calling: Jenkins Millard Relationship to Patient: From [facility]   Best contact number: [phone] ext: [phone]  Provider they see: Ellouise Bollman   Reason for call: Ann called in stating that they do [place] tube feeding supplies and they need the current office notes. She stated that they received the CMN and MD order on Nov 7th.   FAX: [phone]   PRESCRIPTION REFILL ONLY  Name of prescription:  Pharmacy:

## 2024-01-22 ENCOUNTER — Ambulatory Visit (INDEPENDENT_AMBULATORY_CARE_PROVIDER_SITE_OTHER): Payer: MEDICAID | Admitting: Family

## 2024-01-22 VITALS — BP 112/70 | HR 88

## 2024-01-22 DIAGNOSIS — F88 Other disorders of psychological development: Secondary | ICD-10-CM

## 2024-01-22 DIAGNOSIS — M4145 Neuromuscular scoliosis, thoracolumbar region: Secondary | ICD-10-CM

## 2024-01-22 DIAGNOSIS — G800 Spastic quadriplegic cerebral palsy: Secondary | ICD-10-CM

## 2024-01-22 DIAGNOSIS — Q939 Deletion from autosomes, unspecified: Secondary | ICD-10-CM | POA: Diagnosis not present

## 2024-01-22 DIAGNOSIS — Z931 Gastrostomy status: Secondary | ICD-10-CM | POA: Diagnosis not present
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
## 2024-01-22 NOTE — Patient Instructions (Signed)
 It was a pleasure to see you today!  Instructions for you until your next appointment are as follows: Continue Ondra's feedings and medications as prescribed Stop Nystatin  cream Call for questions or concerns Please sign up for MyChart if you have not done so. Please plan to return for follow up in 6 months or sooner if needed.  Feel free to contact our office during normal business hours at [phone] with questions or concerns. If there is no answer or the call is outside business hours, please leave a message and our clinic staff will call you back within the next business day.  If you have an urgent concern, please stay on the line for our after-hours answering service and ask for the on-call neurologist.     I also encourage you to use MyChart to communicate with me more directly. If you have not yet signed up for MyChart within [facility], the front desk staff can help you. However, please note that this inbox is NOT monitored on nights or weekends, and response can take up to 2 business days.  Urgent matters should be discussed with the on-call pediatric neurologist.   At [facility], we are committed to providing exceptional care. You will receive a patient satisfaction survey through text or email regarding your visit today. Your opinion is important to me. Comments are appreciated.

## 2024-01-23 ENCOUNTER — Encounter (INDEPENDENT_AMBULATORY_CARE_PROVIDER_SITE_OTHER): Payer: Self-pay | Admitting: Family

## 2024-08-11 ENCOUNTER — Ambulatory Visit (INDEPENDENT_AMBULATORY_CARE_PROVIDER_SITE_OTHER): Payer: Self-pay | Admitting: Family
